# Patient Record
Sex: Male | Born: 1970
Health system: Southern US, Community
[De-identification: ages and names within clinical notes are randomized; demographics above are authoritative.]

## PROBLEM LIST (undated history)

## (undated) DIAGNOSIS — N200 Calculus of kidney: Secondary | ICD-10-CM

## (undated) DIAGNOSIS — F329 Major depressive disorder, single episode, unspecified: Secondary | ICD-10-CM

## (undated) DIAGNOSIS — F32A Depression, unspecified: Secondary | ICD-10-CM

---

## 2007-04-23 ENCOUNTER — Ambulatory Visit: Payer: Self-pay | Admitting: Family Medicine

## 2012-10-21 ENCOUNTER — Emergency Department: Payer: Self-pay | Admitting: Emergency Medicine

## 2012-10-21 ENCOUNTER — Encounter (HOSPITAL_COMMUNITY): Payer: Self-pay | Admitting: *Deleted

## 2012-10-21 ENCOUNTER — Inpatient Hospital Stay (HOSPITAL_COMMUNITY)
Admission: AD | Admit: 2012-10-21 | Discharge: 2012-10-25 | DRG: 430 | Disposition: A | Discharge status: Home / Self Care | Payer: BC Managed Care – PPO | Source: Intra-hospital | Attending: Psychiatry | Admitting: Psychiatry

## 2012-10-21 DIAGNOSIS — Z79899 Other long term (current) drug therapy: Secondary | ICD-10-CM

## 2012-10-21 DIAGNOSIS — R45851 Suicidal ideations: Secondary | ICD-10-CM

## 2012-10-21 DIAGNOSIS — F332 Major depressive disorder, recurrent severe without psychotic features: Principal | ICD-10-CM | POA: Diagnosis present

## 2012-10-21 HISTORY — DX: Depression, unspecified: F32.A

## 2012-10-21 HISTORY — DX: Major depressive disorder, single episode, unspecified: F32.9

## 2012-10-21 LAB — COMPREHENSIVE METABOLIC PANEL
BUN: 11 mg/dL (ref 7–18)
Bilirubin,Total: 0.8 mg/dL (ref 0.2–1.0)
Chloride: 107 mmol/L (ref 98–107)
EGFR (Non-African Amer.): >60
Potassium: 3.8 mmol/L (ref 3.5–5.1)
SGOT(AST): 20 U/L (ref 15–37)

## 2012-10-21 LAB — URINALYSIS, COMPLETE
Bacteria: NONE SEEN
Blood: NEGATIVE
Ph: 7 (ref 4.5–8.0)
RBC,UR: NONE SEEN /HPF (ref 0–5)
Specific Gravity: 1.024 (ref 1.003–1.030)
Squamous Epithelial: NONE SEEN

## 2012-10-21 LAB — CBC
MCV: 87 fL (ref 80–100)
Platelet: 246 10*3/uL (ref 150–440)
RDW: 12.5 % (ref 11.5–14.5)
WBC: 10 10*3/uL (ref 3.8–10.6)

## 2012-10-21 LAB — DRUG SCREEN, URINE
Cannabinoid 50 Ng, Ur ~~LOC~~: NEGATIVE (ref ?–50)
Cocaine Metabolite,Ur ~~LOC~~: NEGATIVE (ref ?–300)
Methadone, Ur Screen: NEGATIVE (ref ?–300)
Opiate, Ur Screen: NEGATIVE (ref ?–300)
Phencyclidine (PCP) Ur S: NEGATIVE (ref ?–25)
Tricyclic, Ur Screen: NEGATIVE (ref ?–1000)

## 2012-10-21 LAB — TSH: Thyroid Stimulating Horm: 1.93 u[IU]/mL

## 2012-10-21 LAB — ETHANOL: Ethanol %: <0.003 % (ref 0.000–0.080)

## 2012-10-21 MED ORDER — MAGNESIUM HYDROXIDE 400 MG/5ML PO SUSP: 30.0000 mL | Freq: Every day | ORAL | Status: DC | PRN

## 2012-10-21 MED ORDER — NICOTINE 14 MG/24HR TD PT24
14.0000 mg | MEDICATED_PATCH | Freq: Every day | TRANSDERMAL | Status: DC
  Filled 2012-10-21 (×4): qty 1

## 2012-10-21 MED ORDER — TRAZODONE HCL 50 MG PO TABS
50.0000 mg | ORAL_TABLET | Freq: Every evening | ORAL | Status: DC | PRN
  Filled 2012-10-21: qty 1
  Filled 2012-10-21: qty 6
  Filled 2012-10-21 (×5): qty 1
  Filled 2012-10-21: qty 6
  Filled 2012-10-21 (×4): qty 1

## 2012-10-21 MED ORDER — ACETAMINOPHEN 325 MG PO TABS: 650.0000 mg | ORAL_TABLET | Freq: Four times a day (QID) | ORAL | Status: DC | PRN

## 2012-10-21 MED ORDER — ALUM & MAG HYDROXIDE-SIMETH 200-200-20 MG/5ML PO SUSP: 30.0000 mL | ORAL | Status: DC | PRN
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
## 2012-10-21 NOTE — Tx Team (Signed)
Initial Interdisciplinary Treatment Plan  PATIENT STRENGTHS: (choose at least two) Ability for insight Active sense of humor Average or above average intelligence Capable of independent living Communication skills Financial means General fund of knowledge Motivation for treatment/growth Physical Health Supportive family/friends Work skills  PATIENT STRESSORS: Marital or family conflict   PROBLEM LIST: Problem List/Patient Goals Date to be addressed Date deferred Reason deferred Estimated date of resolution  Suicide Risk 10/21/12                                                      DISCHARGE CRITERIA:  Improved stabilization in mood, thinking, and/or behavior Need for constant or close observation no longer present  PRELIMINARY DISCHARGE PLAN: Outpatient therapy Return to previous living arrangement Return to previous work or school arrangements  PATIENT/FAMIILY INVOLVEMENT: This treatment plan has been presented to and reviewed with the patient, Richard Welch, and/or family member, .  The patient and family have been given the opportunity to ask questions and make suggestions.  Richard Welch 10/21/2012, 7:34 PM
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
## 2012-10-22 DIAGNOSIS — F332 Major depressive disorder, recurrent severe without psychotic features: Principal | ICD-10-CM

## 2012-10-22 MED ORDER — DULOXETINE HCL 20 MG PO CPEP
20.0000 mg | ORAL_CAPSULE | Freq: Every day | ORAL | Status: DC
  Administered 2012-10-22 – 2012-10-25 (×4): 20 mg via ORAL
  Filled 2012-10-22 (×5): qty 1
  Filled 2012-10-22: qty 3
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
## 2012-10-22 NOTE — Progress Notes (Signed)
Adult Psychoeducational Group Note  Date:  10/22/2012 Time:  12:50 PM  Group Topic/Focus:  Goals Group:   The focus of this group is to help patients establish daily goals to achieve during treatment and discuss how the patient can incorporate goal setting into their daily lives to aide in recovery.  Participation Level:  Active  Participation Quality:  Attentive, Sharing and Supportive  Affect:  Appropriate  Cognitive:  Alert and Appropriate  Insight: Good  Engagement in Group:  Engaged  Modes of Intervention:  Discussion, Socialization and Support  Additional Comments:  Montell shared his story when asked in group today. He was attentive and appropriate with everyone. He was given resources on support and wellness education. He expressed that he wanted to get marital counseling with his spouse.   Cathlean Cower 10/22/2012, 12:50 PM

## 2012-10-22 NOTE — [facility] Group Notes (Signed)
[facility] LCSW Group Therapy  Feelings Around Relapse 1:15 -2:30        10/22/2012  2:50 PM   Type of Therapy:  Group Therapy  Participation Level:  Appropriate  Participation Quality:  Appropriate  Affect:  Appropriate  Cognitive:  Attentive Appropriate  Insight:  Engaged  Engagement in Therapy:  Engaged  Modes of Intervention:  Discussion Exploration Problem-Solving Supportive  Summary of Progress/Problems:  The topic for today was feelings around relapse.  Patient discussed what relapse prevention is to them and identified triggers that are on the patient to relapse.  Patient processed his feelings toward relapse and was able to related to peers. Patient identified coping skills that can be used to prevent a relapse.Patient shared he cannot go back to holding things in but must talk with someone about his problems.   Wynn Banker 10/22/2012 2:50 PM

## 2012-10-22 NOTE — Progress Notes (Signed)
Adult Psychoeducational Group Note  Date:  10/22/2012 Time:  1:31 PM  Group Topic/Focus:  Relapse Prevention Planning:   The focus of this group is to define relapse and discuss the need for planning to combat relapse.  Participation Level:  Active  Participation Quality:  Appropriate and Attentive  Affect:  Appropriate  Cognitive:  Alert and Appropriate  Insight: Good  Engagement in Group:  Engaged  Modes of Intervention:  Discussion, Socialization and Support  Additional Comments:  Dayshaun participated well in the therapeutic games played today. The values and goals discussed were related to relapse prevention.  Cathlean Cower 10/22/2012, 1:31 PM

## 2012-10-22 NOTE — [facility] Suicide Risk Assessment (Signed)
[facility] INPATIENT:  Family/Significant Other Suicide Prevention Education  Suicide Prevention Education:  Education Completed; Kahne Helfand, Brother, [phone];    has been identified by the patient as the family member/significant other with whom the patient will be residing, and identified as the person(s) who will aid the patient in the event of a mental health crisis (suicidal ideations/suicide attempt).  With written consent from the patient, the family member/significant other has been provided the following suicide prevention education, prior to the and/or following the discharge of the patient.  The suicide prevention education provided includes the following:  Suicide risk factors  Suicide prevention and interventions  National Suicide Hotline telephone number  [facility] assessment telephone number  [facility] Emergency Assistance 911  [facility] and/or Residential Mobile Crisis Unit telephone number  Request made of family/significant other to:  Remove weapons (e.g., guns, rifles, knives), all items previously/currently identified as safety concern.  Patient does not have access to a gun.  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  Remove drugs/medications (over-the-counter, prescriptions, illicit drugs), all items previously/currently identified as a safety concern.  The family member/significant other verbalizes understanding of the suicide prevention education information provided.  The family member/significant other agrees to remove the items of safety concern listed above.  Wynn Banker 10/22/2012, 12:29 PM
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
## 2012-10-22 NOTE — Progress Notes (Signed)
D:  Patient's self inventory sheet, patient sleeps well, has good appetite, normal energy level, good attention span.  Rated depression and hopelessness #1.  SI off/on, contracts for safety.  Denied physical problems.  Zero pain goal, worst pain #1.  After discharge, plans "To do stuff for me and my lil girl and my make everything around positive not negative.  How long will I be here."  No problems taking meds after discharge. A:  Medications administered per MD orders.  Emotional support and encouragement given patient.  R:  Denied SI while talking with nurse.  Contracts for safety.   Denied HI.  Denied A/V hallucinations.  Will continue to monitor patient for safety with 15 minute checks.  Safety maintained.

## 2012-10-22 NOTE — Progress Notes (Signed)
[facility] Group Notes:  (Nursing/MHT/Case Management/Adjunct)  Date:  10/22/2012  Time:  10:33 PM  Type of Therapy:  Group Therapy  Participation Level:  Active  Participation Quality:  Appropriate  Affect:  Appropriate  Cognitive:  Appropriate  Insight:  Appropriate  Engagement in Group:  Engaged  Modes of Intervention:  Activity, Socialization and Support  Summary of Progress/Problems: Pt. Was engaged in activity. Pt. Stated his warning signs for early relapse prevention was focusing on the care of other instead of his own needs.  Sondra Come 10/22/2012, 10:33 PM

## 2012-10-22 NOTE — Progress Notes (Signed)
Patient ID: Richard Welch, male   DOB: 08/06/1970, 42 y.o.   MRN: [id number] D: Patient in room on approach. Pt presented with blunted mood and sad affect. Pt has been isolative in his room all evening with little interaction with peers. Pt denies SI/HI/AVH and pain. Pt did not attend evening karaoke. Pt denies any needs or concerns.  Cooperative with assessment. No acute distressed noted at this time.   A: Met with pt 1:1.  Writer encouraged pt to discuss feelings. Pt encouraged to come to staff with any question or concerns. 15 minutes checks for safety.  R: Patient remains safe.  Continue current POC.

## 2012-10-23 NOTE — Progress Notes (Signed)
The focus of this group is to help patients review their daily goal of treatment and discuss progress on daily workbooks. Pt attended the evening group session and responded to all discussion prompts from the Writer. Pt reported having a good day, the highlights of which were visits from his family, whom he says is very supportive of him and his treatment. Pt's affect was bright and he offered supportive comments to his peers.

## 2012-10-23 NOTE — Progress Notes (Signed)
D) Pt has attended the program and interacts with select peers. Rates his depression and hopelessness both at a 1.  Denies SI and HI. Became a little tearful today when talking about his wife and her taking out a 50 B on him.  A) Given support and reassurance along with praise. Encouraged to go to all the groups. R) Denies SI and HI.

## 2012-10-23 NOTE — [facility] Group Notes (Signed)
[facility] LCSW Group Therapy  Stages of Change 10/23/2012  1:41 PM    Type of Therapy:  Group Therapy  Participation Level:  Active  Participation Quality:  Appropriate  Affect:  Appropriate  Cognitive:  Appropriate  Insight:  Developing/Improving and Engaged  Engagement in Therapy:  Developing/Improving and Engaged  Modes of Intervention:  Discussion, Education, Exploration, Problem-Solving, Rapport Building, Support  Summary of Progress/Problems: The topic for today was the stages of changes.  Patient were asked to identify changes they need to make and their motivation and commitment to making changes in their lives.  Patient shared he has to change his attitude about himself and accept mistakes he has made.      Wynn Banker 10/23/2012  1:41 PM

## 2012-10-23 NOTE — Progress Notes (Signed)
.  Psychoeducational Group Note    Date: 10/23/2012 Time:  0915    Goal Setting Purpose of Group: To be able to set a goal that is measurable and that can be accomplished in one day Participation Level:  Active  Participation Quality:  Appropriate  Affect:  Appropriate  Cognitive:  Oriented  Insight:  Improving  Engagement in Group:  Engaged  Additional Comments:    Augustin Bun A 

## 2012-10-23 NOTE — Progress Notes (Signed)
[facility] MD Progress Note  10/23/2012 6:07 PM Sparsh Callens  MRN:  [id number] Subjective: [name] is a 42 y/o male with no previous psychiatric problems. 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  He reports that he has never hit his wife but will leave the house when he starts to get upset.  He then went to live with his mother and became severely depressed and began to have suicidal thoughts of overdosing on pills. The patient reports that he brought himself to the hospital to seek help as he did not want to leave his daughter without a father.  The patient reports that he has been participating in groups and looking forward to learning coping skills to help him with anger issues.  Diagnosis:   Assessment:  AXIS I: Major Depression, Recurrent severe  AXIS II: Deferred  AXIS III:  Past Medical History   Diagnosis  Date   .  Depression    AXIS IV: economic problems, housing problems, occupational problems, other psychosocial or environmental problems, problems related to social environment and problems with primary support group  AXIS V: 41-50 serious symptoms   ADL's:  Intact  Sleep: Poor  Appetite:  Good  Suicidal Ideation:  Plan:  Patient denies Intent:  Patient denies Means:  Patient denies Homicidal Ideation:  Plan:  Patient denies Intent:  Patient denies Means:  Patient denies AEB (as evidenced by): Patient denies  Psychiatric Specialty Exam: ROS  Blood pressure 125/78, pulse 86, temperature 98.2 F (36.8 C), temperature source Oral, resp. rate 18, height 5\' 7"  (1.702 m), weight 68.04 kg (150 lb).Body mass index is 23.49 kg/(m^2).  General Appearance: Bizarre  Eye Contact::  Good  Speech:  Clear and Coherent and Normal Rate  Volume:  Normal  Mood:  Euphoric  Affect:  Appropriate, Congruent and Full Range  Thought  Process:  Circumstantial, Linear and Logical  Orientation:  Full (Time, Place, and Person)  Thought Content:  WDL  Suicidal Thoughts:  Patient contracts for safety on the unit.  Homicidal Thoughts:  No  Memory:  Immediate;   Good Recent;   Fair Remote;   Good  Judgement:  Good  Insight:  Good  Psychomotor Activity:  Normal  Concentration:  Good  Recall:  Good  Akathisia:  Negative  Handed:  Right  AIMS (if indicated):   None  Assets:  Communication Skills Desire for Improvement Financial Resources/Insurance Housing Physical Health Resilience Social Support Vocational/Educational  Sleep:  Number of Hours: 5.75   Current Medications: Current Facility-Administered Medications  Medication Dose Route Frequency Provider Last Rate Last Dose  . acetaminophen (TYLENOL) tablet 650 mg  650 mg Oral Q6H PRN Kerry Hough, PA-C      . alum & mag hydroxide-simeth (MAALOX/MYLANTA) 200-200-20 MG/5ML suspension 30 mL  30 mL Oral Q4H PRN Kerry Hough, PA-C      . DULoxetine (CYMBALTA) DR capsule 20 mg  20 mg Oral Daily Nehemiah Settle, MD   20 mg at 10/23/12 0802  . magnesium hydroxide (MILK OF MAGNESIA) suspension 30 mL  30 mL Oral Daily PRN Kerry Hough, PA-C      . nicotine (NICODERM CQ - dosed in mg/24 hours) patch 14 mg  14 mg Transdermal Q0600 Kerry Hough, PA-C      . traZODone (  DESYREL) tablet 50 mg  50 mg Oral QHS,MR X 1 Kerry Hough, PA-C        Lab Results: No results found for this or any previous visit (from the past 48 hour(s)).  Physical Findings: AIMS: Facial and Oral Movements Muscles of Facial Expression: None, normal Lips and Perioral Area: None, normal Jaw: None, normal Tongue: None, normal,Extremity Movements Upper (arms, wrists, hands, fingers): None, normal Lower (legs, knees, ankles, toes): None, normal, Trunk Movements Neck, shoulders, hips: None, normal, Overall Severity Severity of abnormal movements (highest score from questions above):  None, normal Incapacitation due to abnormal movements: None, normal Patient's awareness of abnormal movements (rate only patient's report): No Awareness, Dental Status Current problems with teeth and/or dentures?: No Does patient usually wear dentures?: No  CIWA:  CIWA-Ar Total: 2 COWS:  COWS Total Score: 1  Treatment Plan Summary: Daily contact with patient to assess and evaluate symptoms and progress in treatment Medication management  Plan: Observation Level/Precautions: 15 minute checks   Laboratory: Reviewed.  Psychotherapy: Milieu and group therapy   Medications: Cymbalta-20 mg-wil consider increasing dose  Consultations: none   Discharge concerns: safety   Estimated LOS: 4-5 days   Other: Recommended couples counseling after court case as patient will not be able to do this in the hospital until court case is completed.    Medical Decision Making Problem Points:  Established problem, stable/improving (1), Review of last therapy session (1) and Review of psycho-social stressors (1) Data Points:  Review and summation of old records (2) Review of medication regiment & side effects (2) Review of new medications or change in dosage (2)  I certify that inpatient services furnished can reasonably be expected to improve the patient's condition.   Romanita Fager 10/23/2012, 6:07 PM

## 2012-10-23 NOTE — Progress Notes (Signed)
Psychoeducational Group Note  Date: 10/23/2012 Time:  1015  Group Topic/Focus:  Identifying Needs:   The focus of this group is to help patients identify their personal needs that have been historically problematic and identify healthy behaviors to address their needs.  Participation Level:  Active  Participation Quality:  Appropriate  Affect:  Appropriate  Cognitive:  Appropriate  Insight:  Improving  Engagement in Group:  Improving  Additional Comments:  Pt participated in group.   Javarus Dorner A  

## 2012-10-23 NOTE — Progress Notes (Signed)
D: Patient resting in bed with eyes closed. Respirations even and unlabored. No apparent distress noted.  A: Monitoring pt Q15 min for safety  R: Patient remains safe on the unit. Will continue to monitor.  

## 2012-10-24 NOTE — Progress Notes (Signed)
Psychoeducational Group Note  Psychoeducational Group Note  Date: 10/24/2012 Time:  10/24/2012  Group Topic/Focus:  Gratefulness:  The focus of this group is to help patients identify what two things they are most grateful for in their lives. What helps ground them and to center them on their work to their recovery.  Participation Level:  Active  Participation Quality:  Appropriate  Affect:  Appropriate  Cognitive:  Appropriate  Insight:  Lacking  Engagement in Group:  Engaged  Additional Comments:   Nya Monds A  

## 2012-10-24 NOTE — [facility] Group Notes (Signed)
[facility] LCSW Group Therapy  10/24/2012  10:00  Type of Therapy:  Group Therapy  Participation Level:  Active  Participation Quality:  Attentive  Affect:  Appropriate  Cognitive:  Oriented  Insight:  Limited  Engagement in Therapy:  Engaged  Modes of Intervention:  Discussion, Exploration and Socialization  Summary of Progress/Problems:   The main focus of today's process group was to identify the patient's current support system and decide on other supports that can be put in place. Four definitions/levels of support were discussed and an exercise was utilized to show how much stronger we become with additional supports. An emphasis was placed on using counselor, doctor, therapy groups, 12-step groups, and problem-specific support groups to expand supports, as well as doing something different than has been done before. Richard Welch identified his wife as his main support even though she took out IVC papers on him.  Knows now that he needed help, and is hopeful that she will forgive him and continue to be his main support.  Limited interaction, limited insight.  Rod [place], LCSW 10/24/2012 3:20 PM

## 2012-10-24 NOTE — Progress Notes (Signed)
Pt reports having a good day. Is visible on the unit, interacts appropriately. Affect appropriate and mood neutral. Is hopeful for discharge tomorrow. Supported, encouraged. Denies SI/HI/AVH and remains safe. Lawrence Marseilles

## 2012-10-24 NOTE — Progress Notes (Addendum)
[facility] MD Progress Note  10/24/2012 3:05 PM Erol Flanagin  MRN:  [id number] Subjective: [name] is a 42 y/o male with no previous psychiatric problems. The patient reports that he has been doing well on his current medications. He has been participating in groups and has been finding it helpful.  He denies any agitation or outburst.  The patient reports he is taking his medications and denies any side effects.  Diagnosis:   Assessment:  AXIS I: Major Depression, Recurrent severe  AXIS II: Deferred  AXIS III:  Past Medical History   Diagnosis  Date   .  Depression    AXIS IV: economic problems, housing problems, occupational problems, other psychosocial or environmental problems, problems related to social environment and problems with primary support group  AXIS V: 41-50 serious symptoms   ADL's:  Intact  Sleep: Fair  Appetite:  Good  Suicidal Ideation:  Plan:  Patient denies Intent:  Patient denies Means:  Patient denies Homicidal Ideation:  Plan:  Patient denies Intent:  Patient denies Means:  Patient denies AEB (as evidenced by): Patient denies  Psychiatric Specialty Exam: Review of Systems  Constitutional: Negative for fever, chills and weight loss.  Cardiovascular: Negative for chest pain, palpitations and leg swelling.  Gastrointestinal: Negative for heartburn, nausea, vomiting, abdominal pain, diarrhea and constipation.  Neurological: Negative for dizziness, tingling, tremors, sensory change, speech change and headaches.    Blood pressure 125/78, pulse 86, temperature 98.2 F (36.8 C), temperature source Oral, resp. rate 18, height 5\' 7"  (1.702 m), weight 68.04 kg (150 lb).Body mass index is 23.49 kg/(m^2).  General Appearance: Bizarre  Eye Contact::  Good  Speech:  Clear and Coherent and Normal Rate  Volume:  Normal  Mood:  Euphoric  Affect:  Appropriate, Congruent and Full Range  Thought Process:  Circumstantial, Linear and Logical  Orientation:  Full (Time,  Place, and Person)  Thought Content:  WDL  Suicidal Thoughts:  Patient contracts for safety on the unit.  Homicidal Thoughts:  No  Memory:  Immediate;   Good Recent;   Fair Remote;   Good  Judgement:  Good  Insight:  Good  Psychomotor Activity:  Normal  Concentration:  Good  Recall:  Good  Akathisia:  Negative  Handed:  Right  AIMS (if indicated):   None  Assets:  Communication Skills Desire for Improvement Financial Resources/Insurance Housing Physical Health Resilience Social Support Vocational/Educational  Sleep:  Number of Hours: 6.75   Current Medications: Current Facility-Administered Medications  Medication Dose Route Frequency Provider Last Rate Last Dose  . acetaminophen (TYLENOL) tablet 650 mg  650 mg Oral Q6H PRN Kerry Hough, PA-C      . alum & mag hydroxide-simeth (MAALOX/MYLANTA) 200-200-20 MG/5ML suspension 30 mL  30 mL Oral Q4H PRN Kerry Hough, PA-C      . DULoxetine (CYMBALTA) DR capsule 20 mg  20 mg Oral Daily Nehemiah Settle, MD   20 mg at 10/24/12 0839  . magnesium hydroxide (MILK OF MAGNESIA) suspension 30 mL  30 mL Oral Daily PRN Kerry Hough, PA-C      . traZODone (DESYREL) tablet 50 mg  50 mg Oral QHS,MR X 1 Kerry Hough, PA-C        Lab Results: No results found for this or any previous visit (from the past 48 hour(s)).  Physical Findings: AIMS: Facial and Oral Movements Muscles of Facial Expression: None, normal Lips and Perioral Area: None, normal Jaw: None, normal Tongue: None, normal,Extremity Movements  Upper (arms, wrists, hands, fingers): None, normal Lower (legs, knees, ankles, toes): None, normal, Trunk Movements Neck, shoulders, hips: None, normal, Overall Severity Severity of abnormal movements (highest score from questions above): None, normal Incapacitation due to abnormal movements: None, normal Patient's awareness of abnormal movements (rate only patient's report): No Awareness, Dental Status Current  problems with teeth and/or dentures?: No Does patient usually wear dentures?: No  CIWA:  CIWA-Ar Total: 2 COWS:  COWS Total Score: 1  Treatment Plan Summary: Daily contact with patient to assess and evaluate symptoms and progress in treatment Medication management  Plan: Observation Level/Precautions: 15 minute checks   Laboratory: Reviewed.  Psychotherapy: Milieu and group therapy   Medications: Cymbalta-20 mg-wil consider increasing dose if needed  Consultations: none   Discharge concerns: safety   Estimated LOS: 4-5 days-patient should be evaluated for possible D/C by Tuesday  Other: Recommended couples counseling after court case as patient will not be able to do this in the hospital until court case is completed.    Medical Decision Making Problem Points:  Established problem, stable/improving (1), Review of last therapy session (1) and Review of psycho-social stressors (1) Data Points:  Review and summation of old records (2) Review of medication regiment & side effects (2) Review of new medications or change in dosage (2)  I certify that inpatient services furnished can reasonably be expected to improve the patient's condition.   Halina Asano 10/24/2012, 3:05 PM

## 2012-10-24 NOTE — Progress Notes (Signed)
Pt attending the groups and interacting with his peers. Denies SI and HI. Rates his depression and hopelessness both at a 0. States he is feeling much better and feels ready to be discharged. Given support and reassurance along with praise.

## 2012-10-24 NOTE — Progress Notes (Signed)
Spoke with pt 1:1 who reports he's feeling better. States he was told he would most likely be discharged on Monday and feels comfortable with this plan. States his SI has passed and he is feeling more hopeful. Pt supported, encouraged. Offered trazadone but pt states he is able to sleep without it. Denies HI/AVH and remains safe. Lawrence Marseilles

## 2012-10-24 NOTE — Progress Notes (Signed)
Psychoeducational Group Note  Date:  10/24/2012 Time:  1015  Group Topic/Focus:  Making Healthy Choices:   The focus of this group is to help patients identify negative/unhealthy choices they were using prior to admission and identify positive/healthier coping strategies to replace them upon discharge.  Participation Level:  Active  Participation Quality:  Appropriate  Affect:  Depressed  Cognitive:  Alert  Insight:  Improving  Engagement in Group:  Improving  Additional Comments:    Angi Goodell A 10/24/2012 

## 2012-10-25 MED ORDER — TRAZODONE HCL 50 MG PO TABS: 50.0000 mg | ORAL_TABLET | Freq: Every evening | ORAL | Status: DC | PRN

## 2012-10-25 MED ORDER — DULOXETINE HCL 20 MG PO CPEP: 20.0000 mg | ORAL_CAPSULE | Freq: Every day | ORAL | Status: DC

## 2012-10-25 NOTE — Progress Notes (Signed)
Discharge Note:  Patient discharged home with family member.  Denied SI and HI.  Denied A/V hallucinations.  Denied pain.  Suicide prevention information given and discussed with patient, who stated he understood and had no questions.  Patient received all his belongings. belt, shoe laces, clothing, miscellaneous items, toiletries, prescriptions, medications.  Patient stated he appreciated all assistance received from [facility] staff.

## 2012-10-25 NOTE — BHH Group Notes (Signed)
[facility] LCSW Aftercare Discharge Planning Group Note  10/25/2012  1:15 PM    Participation Quality:  Appropriate   Mood/Affect:  Appropriate  Depression Rating:  0  Anxiety Rating:  0  Thoughts of Suicide:  No  Will you contract for safety?   No  Current AVH:  NA  Plan for Discharge/Comments:  Patient reports being much better and ready to discharge home today.  He requested a note for work.  Transportation Means: Patient has transportation.  Supports:  Patient has a good support system.   Shamir Sedlar, [name] July

## 2012-10-25 NOTE — Progress Notes (Signed)
Adult Psychoeducational Group Note  Date:  10/25/2012 Time:  6:47 PM  Group Topic/Focus:  Self Care:   The focus of this group is to help patients understand the importance of self-care in order to improve or restore emotional, physical, spiritual, interpersonal, and financial health.  Participation Level:  Active  Participation Quality:  Appropriate and Attentive  Affect:  Appropriate  Cognitive:  Appropriate  Insight: Appropriate and Good  Engagement in Group:  Developing/Improving  Modes of Intervention:  Activity, Discussion, Education, Socialization and Support  Additional Comments:  Richard Welch attended group and shared during group. Patient defined self care in his own term. Patient was given a self care assessment and completed form and gave incite at times during group of what the weakness and strengths of physical, psychological, spiritual, emotional, relationship care, based on how the patient rated the areas of assessment. Patient concluded with making a goal on how to improve in the areas that need attention.   [name] 10/25/2012, 6:47 PM

## 2012-10-25 NOTE — Progress Notes (Signed)
Adult Psychoeducational Group Note  Date:  10/25/2012 Time:  4:53 AM  Group Topic/Focus:  Wrap-Up Group:   The focus of this group is to help patients review their daily goal of treatment and discuss progress on daily workbooks.  Participation Level:  Active  Participation Quality:  Appropriate  Affect:  Appropriate  Cognitive:  Appropriate  Insight: Limited  Engagement in Group:  Engaged  Modes of Intervention:  Support  Additional Comments:  Patient attended and participated in group tonight. He reports having a good day. He went outside, socialized, attended groups and went to the dining room for meals. His brother came to visit with him today.   Lita Mains [facility] 10/25/2012, 4:53 AM

## 2012-10-25 NOTE — Progress Notes (Deleted)
[facility] Adult Case Management Discharge Plan :  Will you be returning to the same living situation after discharge: Yes,  Patient is discharging home with family. At discharge, do you have transportation home?:Yes,  Patient has arranged transportation. Do you have the ability to pay for your medications:Yes,  Patient has insurance and able to make co-payment  Release of information consent forms completed and in the chart;  Patient's signature needed at discharge.  Patient to Follow up at: Follow-up Information   Follow up with [facility] On 10/26/2012. (Please go to [facility] walk in clinic on Tuesday, October 26, 2012 at [place])    Contact information:   [address] [place], [place]        Patient denies SI/HI:   Patient no longer endorsing SI/HI or other thoughts of self harm.     Safety Planning and Suicide Prevention discussed: .Reviewed with all patients during discharge planning group   Ahana Najera, [name] July 10/25/2012, 10:46 AM
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
## 2012-10-25 NOTE — Tx Team (Signed)
Interdisciplinary Treatment Plan Update   Date Reviewed:  10/25/2012  Time Reviewed:  10:00 AM  Progress in Treatment:   Attending groups: Yes Participating in groups: Yes Taking medication as prescribed: Yes  Tolerating medication: Yes Family/Significant other contact made: Yes, contact made with brother.  Patient understands diagnosis: Yes  Discussing patient identified problems/goals with staff: Yes Medical problems stabilized or resolved: Yes Denies suicidal/homicidal ideation: Yes Patient has not harmed self or others: Yes  For review of initial/current patient goals, please see plan of care.  Estimated Length of Stay:  Discharge home today.  Reasons for Continued Hospitalization:   New Problems/Goals identified:    Discharge Plan or Barriers:   Home with outpatient follow up at [facility].  Additional Comments:  Patient reports doing well and rates all symptoms at zerol  Attendees:  Patient:  10/25/2012 10:00 AM   Signature: Mervyn Gay, MD 10/25/2012 10:00 AM  Signature: 10/25/2012 10:00 AM  Signature:  10/25/2012 10:00 AM  Signature:Beverly Terrilee Croak, RN 10/25/2012 10:00 AM  Signature:  Neill Loft RN 10/25/2012 10:00 AM  Signature:  Juline Patch, LCSW 10/25/2012 10:00 AM  Signature:  10/25/2012 10:00 AM  Signature:  Maseta Dorley,Care Coordinator 10/25/2012 10:00 AM  Signature: Fransisca Kaufmann, [facility] 10/25/2012 10:00 AM  Signature:    Signature:    Signature:      Scribe for Treatment Team:   Juline Patch,  10/25/2012 10:00 AM

## 2012-10-25 NOTE — Progress Notes (Signed)
D:  Patient's self inventory sheet, patient sleeps well, has good appetite, normal energy level, good attention span.   Denied depression and hopelessness.  Denied withdrawals.  Denied SI.  Denied physical problems.  Denied pain.  "Do things for me first and continue to take my medicine and get support from others to help me." A:  Medications administered per MD orders.  Emotional support and encouragement given patient. R:  Denede SI and HI.  Denied A/V hallucinations.  Will continue to monitor every 15 minutes for safety.  Safety maintained.

## 2012-10-25 NOTE — Progress Notes (Signed)
[facility] Adult Case Management Discharge Plan :  Will you be returning to the same living situation after discharge: Yes,  Patient discharging home with family. At discharge, do you have transportation home?:Yes,  Patient has transportation home. Do you have the ability to pay for your medications:Yes,  patient has the ability to obtain medications.  Release of information consent forms completed and in the chart;  Patient's signature needed at discharge.  Patient to Follow up at: Follow-up Information   Follow up with [facility] On 10/26/2012. (Please go to [facility] walk in clinic on Tuesday, October 26, 2012 at [facility])    Contact information:   [address] [place], [place]    [phone]      Patient denies SI/HI:   Patient no longer endorsing SI/HI or other thoughts of self harm.     Safety Planning and Suicide Prevention discussed:  .Reviewed with all patients during discharge planning group     Richard Welch 10/25/2012, 11:00 AM

## 2012-10-27 NOTE — Progress Notes (Signed)
Patient Discharge Instructions:  After Visit Summary (AVS):   Faxed to:  10/27/12 Discharge Summary Note:   Faxed to:  10/27/12 Psychiatric Admission Assessment Note:   Faxed to:  10/27/12 Suicide Risk Assessment - Discharge Assessment:   Faxed to:  10/27/12 Faxed/Sent to the Next Level Care provider:  10/27/12 Faxed to [facility] @ [phone]  Jerelene Redden, 10/27/2012, 2:58 PM

## 2014-02-28 ENCOUNTER — Encounter (HOSPITAL_COMMUNITY): Payer: Self-pay | Admitting: Emergency Medicine

## 2014-02-28 ENCOUNTER — Emergency Department (HOSPITAL_COMMUNITY)
Admission: EM | Admit: 2014-02-28 | Discharge: 2014-03-01 | Disposition: A | Discharge status: Home / Self Care | Payer: BC Managed Care – PPO | Attending: Emergency Medicine | Admitting: Emergency Medicine

## 2014-02-28 DIAGNOSIS — R7989 Other specified abnormal findings of blood chemistry: Secondary | ICD-10-CM | POA: Diagnosis not present

## 2014-02-28 DIAGNOSIS — R1032 Left lower quadrant pain: Secondary | ICD-10-CM | POA: Diagnosis present

## 2014-02-28 DIAGNOSIS — F329 Major depressive disorder, single episode, unspecified: Secondary | ICD-10-CM | POA: Diagnosis not present

## 2014-02-28 DIAGNOSIS — Z880 Allergy status to penicillin: Secondary | ICD-10-CM | POA: Diagnosis not present

## 2014-02-28 DIAGNOSIS — Z79899 Other long term (current) drug therapy: Secondary | ICD-10-CM | POA: Insufficient documentation

## 2014-02-28 DIAGNOSIS — R740 Nonspecific elevation of levels of transaminase and lactic acid dehydrogenase [LDH]: Secondary | ICD-10-CM | POA: Diagnosis not present

## 2014-02-28 DIAGNOSIS — N23 Unspecified renal colic: Secondary | ICD-10-CM | POA: Insufficient documentation

## 2014-02-28 DIAGNOSIS — R7401 Elevation of levels of liver transaminase levels: Secondary | ICD-10-CM

## 2014-02-28 HISTORY — DX: Calculus of kidney: N20.0

## 2014-02-28 MED ORDER — SODIUM CHLORIDE 0.9 % IV SOLN
1000.0000 mL | Freq: Once | INTRAVENOUS | Status: AC
  Administered 2014-03-01: 1000 mL via INTRAVENOUS

## 2014-02-28 MED ORDER — HYDROMORPHONE HCL 1 MG/ML IJ SOLN
1.0000 mg | Freq: Once | INTRAMUSCULAR | Status: AC
  Administered 2014-03-01: 1 mg via INTRAVENOUS
  Filled 2014-02-28: qty 1

## 2014-02-28 MED ORDER — SODIUM CHLORIDE 0.9 % IV SOLN
1000.0000 mL | INTRAVENOUS | Status: DC
  Administered 2014-03-01: 1000 mL via INTRAVENOUS

## 2014-02-28 MED ORDER — ONDANSETRON 8 MG PO TBDP
8.0000 mg | ORAL_TABLET | Freq: Once | ORAL | Status: AC
  Administered 2014-03-01: 8 mg via ORAL
  Filled 2014-02-28: qty 1

## 2014-02-28 NOTE — ED Provider Notes (Signed)
CSN: [id number]     Arrival date & time 02/28/14  2254 History  This chart was scribed for Dione Boozeavid Phil Corti, MD by Murriel HopperAlec Bankhead, ED Scribe. This patient was seen in room APA07/APA07 and the patient's care was started at 11:49 PM.  Chief Complaint  Patient presents with  . Abdominal Pain     (Consider location/radiation/quality/duration/timing/severity/associated sxs/prior Treatment) The history is provided by the patient. No language interpreter was used.   HPI Comments: Richard HawthorneJoseph Welch is a 43 y.o. male who presents to the Emergency Department complaining of constant, sharp, left lower abdomen pain that radiates to the suprapubic area that has worsened since the onset of pain 2 hours PTA. Pt reports pain as a 10/10. Pt reports having Diaphoresis, nausea, and one episode of vomiting but denies chills or fever. Pt reports having urinary urgency, but notes that voiding alleviates pain. Pt also notes that he had a kidney stone in the past (10+ years ago).    Past Medical History  Diagnosis Date  . Depression   . Kidney stone    History reviewed. No pertinent past surgical history. History reviewed. No pertinent family history. History  Substance Use Topics  . Smoking status: Never Smoker   . Smokeless tobacco: Never Used  . Alcohol Use: No    Review of Systems  Constitutional: Positive for diaphoresis. Negative for fever and chills.  Gastrointestinal: Positive for nausea and vomiting.  Genitourinary: Positive for urgency and difficulty urinating.  All other systems reviewed and are negative.     Allergies  Penicillins and Sulfa antibiotics  Home Medications   Prior to Admission medications   Medication Sig Start Date End Date Taking? Authorizing Provider  DULoxetine (CYMBALTA) 20 MG capsule Take 1 capsule (20 mg total) by mouth daily. For depression 10/25/12   Fransisca KaufmannLaura Davis, NP  traZODone (DESYREL) 50 MG tablet Take 1 tablet (50 mg total) by mouth at bedtime and may repeat dose  one time if needed. For sleep. 10/25/12   Fransisca KaufmannLaura Davis, NP   BP 156/91  Pulse 62  Temp(Src) 98.7 F (37.1 C) (Oral)  Resp 20  Ht 5\' 7"  (1.702 m)  Wt 170 lb (77.111 kg)  BMI 26.62 kg/m2  SpO2 99% Physical Exam  Nursing note and vitals reviewed. Constitutional: He is oriented to person, place, and time. He appears well-developed and well-nourished. No distress.  uncomfortable  HENT:  Head: Normocephalic and atraumatic.  Eyes: Conjunctivae are normal. Pupils are equal, round, and reactive to light. No scleral icterus.  Neck: Normal range of motion. Neck supple. No JVD present. No thyromegaly present.  Cardiovascular: Normal rate, regular rhythm and normal heart sounds.  Exam reveals no gallop and no friction rub.   No murmur heard. Pulmonary/Chest: Effort normal and breath sounds normal. No respiratory distress. He has no wheezes. He has no rales.  Abdominal: Soft. Bowel sounds are normal. He exhibits no distension and no mass. There is tenderness. There is no rebound and no guarding.  Left mid-abdomen tenderness No rebound or guarding Bowel sounds decreased No CVA tenderness  Musculoskeletal: Normal range of motion. He exhibits no edema.  Lymphadenopathy:    He has no cervical adenopathy.  Neurological: He is alert and oriented to person, place, and time. He has normal reflexes. No cranial nerve deficit. Coordination normal.  Skin: Skin is warm and dry. No rash noted.  Psychiatric: He has a normal mood and affect. His behavior is normal. Thought content normal.    ED Course  Procedures  DIAGNOSTIC STUDIES: Oxygen Saturation is 99% on RA, normal by my interpretation.    COORDINATION OF CARE: 11:54 PM Discussed treatment plan with pt at bedside and pt agreed to plan.   Labs Review Results for orders placed during the hospital encounter of 02/28/14  URINALYSIS, ROUTINE W REFLEX MICROSCOPIC      Result Value Ref Range   Color, Urine YELLOW  YELLOW   APPearance CLEAR  CLEAR    Specific Gravity, Urine >1.030 (*) 1.005 - 1.030   pH 5.5  5.0 - 8.0   Glucose, UA NEGATIVE  NEGATIVE mg/dL   Hgb urine dipstick LARGE (*) NEGATIVE   Bilirubin Urine NEGATIVE  NEGATIVE   Ketones, ur NEGATIVE  NEGATIVE mg/dL   Protein, ur NEGATIVE  NEGATIVE mg/dL   Urobilinogen, UA 0.2  0.0 - 1.0 mg/dL   Nitrite NEGATIVE  NEGATIVE   Leukocytes, UA NEGATIVE  NEGATIVE  CBC WITH DIFFERENTIAL      Result Value Ref Range   WBC 10.5  4.0 - 10.5 K/uL   RBC 4.66  4.22 - 5.81 MIL/uL   Hemoglobin 14.0  13.0 - 17.0 g/dL   HCT 62.940.5  52.839.0 - 41.352.0 %   MCV 86.9  78.0 - 100.0 fL   MCH 30.0  26.0 - 34.0 pg   MCHC 34.6  30.0 - 36.0 g/dL   RDW 24.412.5  01.011.5 - 27.215.5 %   Platelets 231  150 - 400 K/uL   Neutrophils Relative % 72  43 - 77 %   Neutro Abs 7.5  1.7 - 7.7 K/uL   Lymphocytes Relative 22  12 - 46 %   Lymphs Abs 2.3  0.7 - 4.0 K/uL   Monocytes Relative 5  3 - 12 %   Monocytes Absolute 0.5  0.1 - 1.0 K/uL   Eosinophils Relative 1  0 - 5 %   Eosinophils Absolute 0.2  0.0 - 0.7 K/uL   Basophils Relative 0  0 - 1 %   Basophils Absolute 0.0  0.0 - 0.1 K/uL  COMPREHENSIVE METABOLIC PANEL      Result Value Ref Range   Sodium 142  137 - 147 mEq/L   Potassium 3.8  3.7 - 5.3 mEq/L   Chloride 99  96 - 112 mEq/L   CO2 28  19 - 32 mEq/L   Glucose, Bld 148 (*) 70 - 99 mg/dL   BUN 14  6 - 23 mg/dL   Creatinine, Ser 5.361.43 (*) 0.50 - 1.35 mg/dL   Calcium 64.410.0  8.4 - 03.410.5 mg/dL   Total Protein 7.8  6.0 - 8.3 g/dL   Albumin 4.5  3.5 - 5.2 g/dL   AST 39 (*) 0 - 37 U/L   ALT 59 (*) 0 - 53 U/L   Alkaline Phosphatase 81  39 - 117 U/L   Total Bilirubin 0.5  0.3 - 1.2 mg/dL   GFR calc non Af Amer 60 (*) >90 mL/min   GFR calc Af Amer 69 (*) >90 mL/min   Anion gap 15  5 - 15  LIPASE, BLOOD      Result Value Ref Range   Lipase 29  11 - 59 U/L  URINE MICROSCOPIC-ADD ON      Result Value Ref Range   Squamous Epithelial / LPF RARE  RARE   RBC / HPF 3-6  <3 RBC/hpf   Bacteria, UA RARE  RARE   Urine-Other  MICROSCOPIC EXAM PERFORMED ON UNCONCENTRATED URINE     Imaging Review Ct  Renal Stone Study  03/01/2014   CLINICAL DATA:  Left lower quadrant pain radiating to the suprapubic area for 2 hr. Nausea and vomiting. Previous history of kidney stones 10 years ago.  EXAM: CT ABDOMEN AND PELVIS WITHOUT CONTRAST  TECHNIQUE: Multidetector CT imaging of the abdomen and pelvis was performed following the standard protocol without IV contrast.  COMPARISON:  None.  FINDINGS: Lung bases are clear.  Tiny calcification in the distal left ureter just above the ureterovesical junction, measuring less than 2 mm diameter. There is mild to moderate proximal ureterectasis and pyelocaliectasis on the left with moderate periureteral stranding. Changes are consistent with moderate obstruction. Right kidney and ureter are normal. The no bladder stone or bladder wall thickening identified.  Prominent diffuse fatty infiltration throughout the liver. The unenhanced appearance of the gallbladder, spleen, pancreas, adrenal glands, abdominal aorta, inferior vena cava, and retroperitoneal lymph nodes is unremarkable. Stomach, small bowel, and colon are not abnormally distended. No free air or free fluid in the abdomen. Minimal umbilical hernia containing fat.  Pelvis: Appendix is normal. Prostate gland is not enlarged. No free or loculated pelvic fluid collections. No pelvic mass or lymphadenopathy is appreciated. No destructive bone lesions.  IMPRESSION: Punctate size stone in the distal left ureter with moderate proximal obstruction. Diffuse fatty infiltration of the liver.   Electronically Signed   By: Burman Nieves M.D.   On: 03/01/2014 01:00   Images viewed by me.  MDM   Final diagnoses:  Abdominal pain, LLQ (left lower quadrant)  Ureteral colic  Elevated serum creatinine  Elevated transaminase level    Left-sided pain worrisome for possible kidney stone, consider possibility of diverticulitis. He'll be given IV fluids, IV  hydromorphone for pain and ondansetron for nausea. He will be sent for a CT scan. Old records are reviewed and he has no relevant past visits.  He had good relief of pain with the above noted treatment. CT shows a distal left ureteral calculus. Incidental finding of fatty infiltration of liver. Creatinine has come back mildly elevated as have AST and ALT. Patient states that he does not consume alcohol at all. These will need to be followed up as an outpatient. Mild hyperglycemia is also present and will need to be rechecked. He is discharged with prescriptions for ondansetron and oxycodone-acetaminophen. He is advised to avoid NSAIDs.  I personally performed the services described in this documentation, which was scribed in my presence. The recorded information has been reviewed and is accurate.     Dione Booze, MD 03/01/14 [phone]

## 2014-02-28 NOTE — ED Notes (Signed)
LLq pain , no n/v,   Pt says feel like kidney stone.

## 2014-03-01 ENCOUNTER — Emergency Department (HOSPITAL_COMMUNITY): Payer: BC Managed Care – PPO

## 2014-03-01 LAB — URINALYSIS, ROUTINE W REFLEX MICROSCOPIC
BILIRUBIN URINE: NEGATIVE
GLUCOSE, UA: NEGATIVE mg/dL
Ketones, ur: NEGATIVE mg/dL
Leukocytes, UA: NEGATIVE
Nitrite: NEGATIVE
PH: 5.5 (ref 5.0–8.0)
Protein, ur: NEGATIVE mg/dL
Urobilinogen, UA: 0.2 mg/dL (ref 0.0–1.0)

## 2014-03-01 LAB — COMPREHENSIVE METABOLIC PANEL
ALK PHOS: 81 U/L (ref 39–117)
ALT: 59 U/L — AB (ref 0–53)
ANION GAP: 15 (ref 5–15)
AST: 39 U/L — ABNORMAL HIGH (ref 0–37)
Albumin: 4.5 g/dL (ref 3.5–5.2)
BUN: 14 mg/dL (ref 6–23)
CALCIUM: 10 mg/dL (ref 8.4–10.5)
CO2: 28 meq/L (ref 19–32)
Chloride: 99 mEq/L (ref 96–112)
Creatinine, Ser: 1.43 mg/dL — ABNORMAL HIGH (ref 0.50–1.35)
GFR, EST AFRICAN AMERICAN: 69 mL/min — AB (ref >90)
GFR, EST NON AFRICAN AMERICAN: 60 mL/min — AB (ref >90)
GLUCOSE: 148 mg/dL — AB (ref 70–99)
Potassium: 3.8 mEq/L (ref 3.7–5.3)
SODIUM: 142 meq/L (ref 137–147)
Total Bilirubin: 0.5 mg/dL (ref 0.3–1.2)
Total Protein: 7.8 g/dL (ref 6.0–8.3)

## 2014-03-01 LAB — CBC WITH DIFFERENTIAL/PLATELET
Basophils Absolute: 0 10*3/uL (ref 0.0–0.1)
Basophils Relative: 0 % (ref 0–1)
EOS PCT: 1 % (ref 0–5)
Eosinophils Absolute: 0.2 10*3/uL (ref 0.0–0.7)
HEMATOCRIT: 40.5 % (ref 39.0–52.0)
HEMOGLOBIN: 14 g/dL (ref 13.0–17.0)
LYMPHS ABS: 2.3 10*3/uL (ref 0.7–4.0)
LYMPHS PCT: 22 % (ref 12–46)
MCH: 30 pg (ref 26.0–34.0)
MCHC: 34.6 g/dL (ref 30.0–36.0)
MCV: 86.9 fL (ref 78.0–100.0)
MONOS PCT: 5 % (ref 3–12)
Monocytes Absolute: 0.5 10*3/uL (ref 0.1–1.0)
Neutro Abs: 7.5 10*3/uL (ref 1.7–7.7)
Neutrophils Relative %: 72 % (ref 43–77)
PLATELETS: 231 10*3/uL (ref 150–400)
RBC: 4.66 MIL/uL (ref 4.22–5.81)
RDW: 12.5 % (ref 11.5–15.5)
WBC: 10.5 10*3/uL (ref 4.0–10.5)

## 2014-03-01 LAB — URINE MICROSCOPIC-ADD ON

## 2014-03-01 LAB — LIPASE, BLOOD: Lipase: 29 U/L (ref 11–59)

## 2014-03-01 MED ORDER — ONDANSETRON HCL 4 MG PO TABS: 4.0000 mg | ORAL_TABLET | Freq: Four times a day (QID) | ORAL | Status: DC | PRN

## 2014-03-01 MED ORDER — OXYCODONE-ACETAMINOPHEN 5-325 MG PO TABS: 1.0000 | ORAL_TABLET | ORAL | Status: DC | PRN

## 2014-03-01 NOTE — Discharge Instructions (Signed)
Blood tests of your kidney (creatinine) and liver (AST and ALT) were slightly elevated today. These should be rechecked in 2-4 weeks by your doctor.  Kidney Stones Kidney stones (urolithiasis) are deposits that form inside your kidneys. The intense pain is caused by the stone moving through the urinary tract. When the stone moves, the ureter goes into spasm around the stone. The stone is usually passed in the urine.  CAUSES   A disorder that makes certain neck glands produce too much parathyroid hormone (primary hyperparathyroidism).  A buildup of uric acid crystals, similar to gout in your joints.  Narrowing (stricture) of the ureter.  A kidney obstruction present at birth (congenital obstruction).  Previous surgery on the kidney or ureters.  Numerous kidney infections. SYMPTOMS   Feeling sick to your stomach (nauseous).  Throwing up (vomiting).  Blood in the urine (hematuria).  Pain that usually spreads (radiates) to the groin.  Frequency or urgency of urination. DIAGNOSIS   Taking a history and physical exam.  Blood or urine tests.  CT scan.  Occasionally, an examination of the inside of the urinary bladder (cystoscopy) is performed. TREATMENT   Observation.  Increasing your fluid intake.  Extracorporeal shock wave lithotripsy--This is a noninvasive procedure that uses shock waves to break up kidney stones.  Surgery may be needed if you have severe pain or persistent obstruction. There are various surgical procedures. Most of the procedures are performed with the use of small instruments. Only small incisions are needed to accommodate these instruments, so recovery time is minimized. The size, location, and chemical composition are all important variables that will determine the proper choice of action for you. Talk to your health care provider to better understand your situation so that you will minimize the risk of injury to yourself and your kidney.  HOME CARE  INSTRUCTIONS   Drink enough water and fluids to keep your urine clear or pale yellow. This will help you to pass the stone or stone fragments.  Strain all urine through the provided strainer. Keep all particulate matter and stones for your health care provider to see. The stone causing the pain may be as small as a grain of salt. It is very important to use the strainer each and every time you pass your urine. The collection of your stone will allow your health care provider to analyze it and verify that a stone has actually passed. The stone analysis will often identify what you can do to reduce the incidence of recurrences.  Only take over-the-counter or prescription medicines for pain, discomfort, or fever as directed by your health care provider.  Make a follow-up appointment with your health care provider as directed.  Get follow-up X-rays if required. The absence of pain does not always mean that the stone has passed. It may have only stopped moving. If the urine remains completely obstructed, it can cause loss of kidney function or even complete destruction of the kidney. It is your responsibility to make sure X-rays and follow-ups are completed. Ultrasounds of the kidney can show blockages and the status of the kidney. Ultrasounds are not associated with any radiation and can be performed easily in a matter of minutes. SEEK MEDICAL CARE IF:  You experience pain that is progressive and unresponsive to any pain medicine you have been prescribed. SEEK IMMEDIATE MEDICAL CARE IF:   Pain cannot be controlled with the prescribed medicine.  You have a fever or shaking chills.  The severity or intensity of pain increases  over 18 hours and is not relieved by pain medicine.  You develop a new onset of abdominal pain.  You feel faint or pass out.  You are unable to urinate. MAKE SURE YOU:   Understand these instructions.  Will watch your condition.  Will get help right away if you are  not doing well or get worse. Document Released: 04/28/2005 Document Revised: 12/29/2012 Document Reviewed: 09/29/2012 [facility] Patient Information 2015 [place], [place]. This information is not intended to replace advice given to you by your health care provider. Make sure you discuss any questions you have with your health care provider.  Acetaminophen; Oxycodone tablets What is this medicine? ACETAMINOPHEN; OXYCODONE (a set a MEE noe fen; ox i KOE done) is a pain reliever. It is used to treat mild to moderate pain. This medicine may be used for other purposes; ask your health care provider or pharmacist if you have questions. COMMON BRAND NAME(S): Endocet, Magnacet, Narvox, Percocet, Perloxx, Primalev, Primlev, Roxicet, Xolox What should I tell my health care provider before I take this medicine? They need to know if you have any of these conditions: -brain tumor -Crohn's disease, inflammatory bowel disease, or ulcerative colitis -drug abuse or addiction -head injury -heart or circulation problems -if you often drink alcohol -kidney disease or problems going to the bathroom -liver disease -lung disease, asthma, or breathing problems -an unusual or allergic reaction to acetaminophen, oxycodone, other opioid analgesics, other medicines, foods, dyes, or preservatives -pregnant or trying to get pregnant -breast-feeding How should I use this medicine? Take this medicine by mouth with a full glass of water. Follow the directions on the prescription label. Take your medicine at regular intervals. Do not take your medicine more often than directed. Talk to your pediatrician regarding the use of this medicine in children. Special care may be needed. Patients over 41 years old may have a stronger reaction and need a smaller dose. Overdosage: If you think you have taken too much of this medicine contact a poison control center or emergency room at once. NOTE: This medicine is only for you. Do not  share this medicine with others. What if I miss a dose? If you miss a dose, take it as soon as you can. If it is almost time for your next dose, take only that dose. Do not take double or extra doses. What may interact with this medicine? -alcohol -antihistamines -barbiturates like amobarbital, butalbital, butabarbital, methohexital, pentobarbital, phenobarbital, thiopental, and secobarbital -benztropine -drugs for bladder problems like solifenacin, trospium, oxybutynin, tolterodine, hyoscyamine, and methscopolamine -drugs for breathing problems like ipratropium and tiotropium -drugs for certain stomach or intestine problems like propantheline, homatropine methylbromide, glycopyrrolate, atropine, belladonna, and dicyclomine -general anesthetics like etomidate, ketamine, nitrous oxide, propofol, desflurane, enflurane, halothane, isoflurane, and sevoflurane -medicines for depression, anxiety, or psychotic disturbances -medicines for sleep -muscle relaxants -naltrexone -narcotic medicines (opiates) for pain -phenothiazines like perphenazine, thioridazine, chlorpromazine, mesoridazine, fluphenazine, prochlorperazine, promazine, and trifluoperazine -scopolamine -tramadol -trihexyphenidyl This list may not describe all possible interactions. Give your health care provider a list of all the medicines, herbs, non-prescription drugs, or dietary supplements you use. Also tell them if you smoke, drink alcohol, or use illegal drugs. Some items may interact with your medicine. What should I watch for while using this medicine? Tell your doctor or health care professional if your pain does not go away, if it gets worse, or if you have new or a different type of pain. You may develop tolerance to the medicine. Tolerance means that  you will need a higher dose of the medication for pain relief. Tolerance is normal and is expected if you take this medicine for a long time. Do not suddenly stop taking your  medicine because you may develop a severe reaction. Your body becomes used to the medicine. This does NOT mean you are addicted. Addiction is a behavior related to getting and using a drug for a non-medical reason. If you have pain, you have a medical reason to take pain medicine. Your doctor will tell you how much medicine to take. If your doctor wants you to stop the medicine, the dose will be slowly lowered over time to avoid any side effects. You may get drowsy or dizzy. Do not drive, use machinery, or do anything that needs mental alertness until you know how this medicine affects you. Do not stand or sit up quickly, especially if you are an older patient. This reduces the risk of dizzy or fainting spells. Alcohol may interfere with the effect of this medicine. Avoid alcoholic drinks. There are different types of narcotic medicines (opiates) for pain. If you take more than one type at the same time, you may have more side effects. Give your health care provider a list of all medicines you use. Your doctor will tell you how much medicine to take. Do not take more medicine than directed. Call emergency for help if you have problems breathing. The medicine will cause constipation. Try to have a bowel movement at least every 2 to 3 days. If you do not have a bowel movement for 3 days, call your doctor or health care professional. Do not take Tylenol (acetaminophen) or medicines that have acetaminophen with this medicine. Too much acetaminophen can be very dangerous. Many nonprescription medicines contain acetaminophen. Always read the labels carefully to avoid taking more acetaminophen. What side effects may I notice from receiving this medicine? Side effects that you should report to your doctor or health care professional as soon as possible: -allergic reactions like skin rash, itching or hives, swelling of the face, lips, or tongue -breathing difficulties, wheezing -confusion -light headedness or  fainting spells -severe stomach pain -unusually weak or tired -yellowing of the skin or the whites of the eyes Side effects that usually do not require medical attention (report to your doctor or health care professional if they continue or are bothersome): -dizziness -drowsiness -nausea -vomiting This list may not describe all possible side effects. Call your doctor for medical advice about side effects. You may report side effects to FDA at 1-800-FDA-1088. Where should I keep my medicine? Keep out of the reach of children. This medicine can be abused. Keep your medicine in a safe place to protect it from theft. Do not share this medicine with anyone. Selling or giving away this medicine is dangerous and against the law. Store at room temperature between 20 and 25 degrees C (68 and 77 degrees F). Keep container tightly closed. Protect from light. This medicine may cause accidental overdose and death if it is taken by other adults, children, or pets. Flush any unused medicine down the toilet to reduce the chance of harm. Do not use the medicine after the expiration date. NOTE: This sheet is a summary. It may not cover all possible information. If you have questions about this medicine, talk to your doctor, pharmacist, or health care provider.  2015, [employer] Standard. (2012-12-20 13:17:35)  Ondansetron tablets What is this medicine? ONDANSETRON (on DAN se tron) is used to treat nausea and  vomiting caused by chemotherapy. It is also used to prevent or treat nausea and vomiting after surgery. This medicine may be used for other purposes; ask your health care provider or pharmacist if you have questions. COMMON BRAND NAME(S): Zofran What should I tell my health care provider before I take this medicine? They need to know if you have any of these conditions: -heart disease -history of irregular heartbeat -liver disease -low levels of magnesium or potassium in the blood -an unusual or  allergic reaction to ondansetron, granisetron, other medicines, foods, dyes, or preservatives -pregnant or trying to get pregnant -breast-feeding How should I use this medicine? Take this medicine by mouth with a glass of water. Follow the directions on your prescription label. Take your doses at regular intervals. Do not take your medicine more often than directed. Talk to your pediatrician regarding the use of this medicine in children. Special care may be needed. Overdosage: If you think you have taken too much of this medicine contact a poison control center or emergency room at once. NOTE: This medicine is only for you. Do not share this medicine with others. What if I miss a dose? If you miss a dose, take it as soon as you can. If it is almost time for your next dose, take only that dose. Do not take double or extra doses. What may interact with this medicine? Do not take this medicine with any of the following medications: -apomorphine -certain medicines for fungal infections like fluconazole, itraconazole, ketoconazole, posaconazole, voriconazole -cisapride -dofetilide -dronedarone -pimozide -thioridazine -ziprasidone This medicine may also interact with the following medications: -carbamazepine -certain medicines for depression, anxiety, or psychotic disturbances -fentanyl -linezolid -MAOIs like Carbex, Eldepryl, Marplan, Nardil, and Parnate -methylene blue (injected into a vein) -other medicines that prolong the QT interval (cause an abnormal heart rhythm) -phenytoin -rifampicin -tramadol This list may not describe all possible interactions. Give your health care provider a list of all the medicines, herbs, non-prescription drugs, or dietary supplements you use. Also tell them if you smoke, drink alcohol, or use illegal drugs. Some items may interact with your medicine. What should I watch for while using this medicine? Check with your doctor or health care professional  right away if you have any sign of an allergic reaction. What side effects may I notice from receiving this medicine? Side effects that you should report to your doctor or health care professional as soon as possible: -allergic reactions like skin rash, itching or hives, swelling of the face, lips or tongue -breathing problems -confusion -dizziness -fast or irregular heartbeat -feeling faint or lightheaded, falls -fever and chills -loss of balance or coordination -seizures -sweating -swelling of the hands or feet -tightness in the chest -tremors -unusually weak or tired Side effects that usually do not require medical attention (report to your doctor or health care professional if they continue or are bothersome): -constipation or diarrhea -headache This list may not describe all possible side effects. Call your doctor for medical advice about side effects. You may report side effects to FDA at 1-800-FDA-1088. Where should I keep my medicine? Keep out of the reach of children. Store between 2 and 30 degrees C (36 and 86 degrees F). Throw away any unused medicine after the expiration date. NOTE: This sheet is a summary. It may not cover all possible information. If you have questions about this medicine, talk to your doctor, pharmacist, or health care provider.  2015, [employer] Standard. (2013-02-02 16:27:45)

## 2014-03-01 NOTE — ED Notes (Signed)
Pt alert & oriented x4, stable gait. Patient given discharge instructions, paperwork & prescription(s). Patient  instructed to stop at the registration desk to finish any additional paperwork. Patient verbalized understanding. Pt left department w/ no further questions. 

## 2014-03-06 MED FILL — Ondansetron HCl Tab 4 MG: ORAL | Qty: 4 | Status: AC

## 2014-03-06 MED FILL — Oxycodone w/ Acetaminophen Tab 5-325 MG: ORAL | Qty: 6 | Status: AC

## 2014-04-10 LAB — URINALYSIS, COMPLETE
BACTERIA: NONE SEEN
BLOOD: NEGATIVE
Bilirubin,UR: NEGATIVE
Glucose,UR: 150 mg/dL (ref 0–75)
Leukocyte Esterase: NEGATIVE
Nitrite: NEGATIVE
PROTEIN: NEGATIVE
Ph: 5 (ref 4.5–8.0)
RBC,UR: 1 /HPF (ref 0–5)
Specific Gravity: 1.021 (ref 1.003–1.030)
Squamous Epithelial: <1

## 2014-04-10 LAB — COMPREHENSIVE METABOLIC PANEL
ALBUMIN: 4.2 g/dL (ref 3.4–5.0)
Alkaline Phosphatase: 82 U/L
Anion Gap: 10 (ref 7–16)
BILIRUBIN TOTAL: 0.6 mg/dL (ref 0.2–1.0)
BUN: 18 mg/dL (ref 7–18)
CHLORIDE: 101 mmol/L (ref 98–107)
CREATININE: 1.24 mg/dL (ref 0.60–1.30)
Calcium, Total: 9.2 mg/dL (ref 8.5–10.1)
Co2: 28 mmol/L (ref 21–32)
EGFR (African American): >60
EGFR (Non-African Amer.): >60
Glucose: 202 mg/dL — ABNORMAL HIGH (ref 65–99)
OSMOLALITY: 285 (ref 275–301)
POTASSIUM: 3.8 mmol/L (ref 3.5–5.1)
SGOT(AST): 36 U/L (ref 15–37)
SGPT (ALT): 56 U/L
Sodium: 139 mmol/L (ref 136–145)
TOTAL PROTEIN: 7.9 g/dL (ref 6.4–8.2)

## 2014-04-10 LAB — CBC
HCT: 44.9 % (ref 40.0–52.0)
HGB: 15.1 g/dL (ref 13.0–18.0)
MCH: 29.9 pg (ref 26.0–34.0)
MCHC: 33.6 g/dL (ref 32.0–36.0)
MCV: 89 fL (ref 80–100)
PLATELETS: 234 10*3/uL (ref 150–440)
RBC: 5.05 10*6/uL (ref 4.40–5.90)
RDW: 12.7 % (ref 11.5–14.5)
WBC: 8.7 10*3/uL (ref 3.8–10.6)

## 2014-04-10 LAB — DRUG SCREEN, URINE

## 2014-04-10 LAB — ACETAMINOPHEN LEVEL: Acetaminophen: <2 ug/mL

## 2014-04-10 LAB — ETHANOL: Ethanol: <3 mg/dL

## 2014-04-10 LAB — SALICYLATE LEVEL: Salicylates, Serum: <1.7 mg/dL

## 2014-04-11 ENCOUNTER — Inpatient Hospital Stay: Payer: Self-pay | Admitting: Psychiatry

## 2014-04-11 LAB — HEMOGLOBIN A1C: HEMOGLOBIN A1C: 5.7 % (ref 4.2–6.3)

## 2014-09-02 NOTE — Consult Note (Signed)
Called for elevated sugar.hemoglobin a1c is 5.7- patient is not a diabetic.consult needed please call back.  Electronic Signatures: Alford HighlandWieting, Quantae Martel (MD)  (Signed on 02-Dec-15 13:22)  Authored  Last Updated: 02-Dec-15 13:22 by Alford HighlandWieting, Romen Yutzy (MD)
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
## 2018-02-02 ENCOUNTER — Encounter (HOSPITAL_COMMUNITY): Payer: Self-pay

## 2018-02-02 ENCOUNTER — Other Ambulatory Visit: Payer: Self-pay

## 2018-02-02 ENCOUNTER — Emergency Department (HOSPITAL_COMMUNITY)
Admission: EM | Admit: 2018-02-02 | Discharge: 2018-02-02 | Disposition: A | Discharge status: Home / Self Care | Payer: BC Managed Care – PPO | Attending: Emergency Medicine | Admitting: Emergency Medicine

## 2018-02-02 ENCOUNTER — Emergency Department (HOSPITAL_COMMUNITY): Payer: BC Managed Care – PPO

## 2018-02-02 DIAGNOSIS — Z79899 Other long term (current) drug therapy: Secondary | ICD-10-CM | POA: Diagnosis not present

## 2018-02-02 DIAGNOSIS — M79622 Pain in left upper arm: Secondary | ICD-10-CM | POA: Diagnosis not present

## 2018-02-02 DIAGNOSIS — S7002XA Contusion of left hip, initial encounter: Secondary | ICD-10-CM | POA: Insufficient documentation

## 2018-02-02 DIAGNOSIS — Z23 Encounter for immunization: Secondary | ICD-10-CM | POA: Insufficient documentation

## 2018-02-02 DIAGNOSIS — S40022A Contusion of left upper arm, initial encounter: Secondary | ICD-10-CM | POA: Diagnosis not present

## 2018-02-02 DIAGNOSIS — Y92414 Local residential or business street as the place of occurrence of the external cause: Secondary | ICD-10-CM | POA: Diagnosis not present

## 2018-02-02 DIAGNOSIS — S4992XA Unspecified injury of left shoulder and upper arm, initial encounter: Secondary | ICD-10-CM | POA: Diagnosis not present

## 2018-02-02 DIAGNOSIS — S40012A Contusion of left shoulder, initial encounter: Secondary | ICD-10-CM

## 2018-02-02 DIAGNOSIS — M25552 Pain in left hip: Secondary | ICD-10-CM | POA: Diagnosis not present

## 2018-02-02 DIAGNOSIS — S79912A Unspecified injury of left hip, initial encounter: Secondary | ICD-10-CM | POA: Diagnosis not present

## 2018-02-02 DIAGNOSIS — Y999 Unspecified external cause status: Secondary | ICD-10-CM | POA: Insufficient documentation

## 2018-02-02 DIAGNOSIS — Y9389 Activity, other specified: Secondary | ICD-10-CM | POA: Diagnosis not present

## 2018-02-02 DIAGNOSIS — T07XXXA Unspecified multiple injuries, initial encounter: Secondary | ICD-10-CM | POA: Insufficient documentation

## 2018-02-02 DIAGNOSIS — S301XXA Contusion of abdominal wall, initial encounter: Secondary | ICD-10-CM | POA: Diagnosis not present

## 2018-02-02 MED ORDER — NAPROXEN 500 MG PO TABS: 500.0000 mg | ORAL_TABLET | Freq: Two times a day (BID) | ORAL | 0 refills | Status: DC

## 2018-02-02 MED ORDER — HYDROCODONE-ACETAMINOPHEN 5-325 MG PO TABS
1.0000 | ORAL_TABLET | Freq: Once | ORAL | Status: AC
  Administered 2018-02-02: 1 via ORAL
  Filled 2018-02-02: qty 1

## 2018-02-02 MED ORDER — TETANUS-DIPHTH-ACELL PERTUSSIS 5-2.5-18.5 LF-MCG/0.5 IM SUSP
0.5000 mL | Freq: Once | INTRAMUSCULAR | Status: AC
  Administered 2018-02-02: 0.5 mL via INTRAMUSCULAR
  Filled 2018-02-02: qty 0.5

## 2018-02-02 MED ORDER — CYCLOBENZAPRINE HCL 5 MG PO TABS: 5.0000 mg | ORAL_TABLET | Freq: Two times a day (BID) | ORAL | 0 refills | Status: DC | PRN
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
## 2018-02-08 DIAGNOSIS — Z1389 Encounter for screening for other disorder: Secondary | ICD-10-CM | POA: Diagnosis not present

## 2018-02-08 DIAGNOSIS — Z6827 Body mass index (BMI) 27.0-27.9, adult: Secondary | ICD-10-CM | POA: Diagnosis not present

## 2018-02-08 DIAGNOSIS — R7309 Other abnormal glucose: Secondary | ICD-10-CM | POA: Diagnosis not present

## 2018-02-08 DIAGNOSIS — Z0001 Encounter for general adult medical examination with abnormal findings: Secondary | ICD-10-CM | POA: Diagnosis not present

## 2018-02-08 DIAGNOSIS — S301XXA Contusion of abdominal wall, initial encounter: Secondary | ICD-10-CM | POA: Diagnosis not present

## 2018-02-08 DIAGNOSIS — E663 Overweight: Secondary | ICD-10-CM | POA: Diagnosis not present

## 2018-02-08 DIAGNOSIS — Z0000 Encounter for general adult medical examination without abnormal findings: Secondary | ICD-10-CM | POA: Diagnosis not present

## 2018-10-07 DIAGNOSIS — U071 COVID-19: Secondary | ICD-10-CM | POA: Diagnosis not present

## 2018-10-07 DIAGNOSIS — Z20828 Contact with and (suspected) exposure to other viral communicable diseases: Secondary | ICD-10-CM | POA: Diagnosis not present

## 2018-10-21 DIAGNOSIS — Z20828 Contact with and (suspected) exposure to other viral communicable diseases: Secondary | ICD-10-CM | POA: Diagnosis not present

## 2018-10-21 DIAGNOSIS — U071 COVID-19: Secondary | ICD-10-CM | POA: Diagnosis not present

## 2018-10-28 DIAGNOSIS — Z20828 Contact with and (suspected) exposure to other viral communicable diseases: Secondary | ICD-10-CM | POA: Diagnosis not present

## 2019-05-21 ENCOUNTER — Emergency Department
Admission: EM | Admit: 2019-05-21 | Discharge: 2019-05-21 | Disposition: A | Discharge status: Home / Self Care | Payer: BC Managed Care – PPO | Attending: Emergency Medicine | Admitting: Emergency Medicine

## 2019-05-21 ENCOUNTER — Emergency Department: Payer: BC Managed Care – PPO

## 2019-05-21 ENCOUNTER — Other Ambulatory Visit: Payer: Self-pay

## 2019-05-21 DIAGNOSIS — H524 Presbyopia: Secondary | ICD-10-CM | POA: Diagnosis not present

## 2019-05-21 DIAGNOSIS — Z79899 Other long term (current) drug therapy: Secondary | ICD-10-CM | POA: Insufficient documentation

## 2019-05-21 DIAGNOSIS — H538 Other visual disturbances: Secondary | ICD-10-CM | POA: Diagnosis present

## 2019-05-21 LAB — DIFFERENTIAL
Abs Immature Granulocytes: 0.03 10*3/uL (ref 0.00–0.07)
Basophils Absolute: 0.1 10*3/uL (ref 0.0–0.1)
Basophils Relative: 1 %
Eosinophils Absolute: 0.1 10*3/uL (ref 0.0–0.5)
Eosinophils Relative: 2 %
Immature Granulocytes: 0 %
Lymphocytes Relative: 29 %
Lymphs Abs: 2.1 10*3/uL (ref 0.7–4.0)
Monocytes Absolute: 0.5 10*3/uL (ref 0.1–1.0)
Monocytes Relative: 6 %
Neutro Abs: 4.5 10*3/uL (ref 1.7–7.7)
Neutrophils Relative %: 62 %

## 2019-05-21 LAB — COMPREHENSIVE METABOLIC PANEL
ALT: 43 U/L (ref 0–44)
AST: 34 U/L (ref 15–41)
Albumin: 4.5 g/dL (ref 3.5–5.0)
Alkaline Phosphatase: 84 U/L (ref 38–126)
Anion gap: 9 (ref 5–15)
BUN: 14 mg/dL (ref 6–20)
CO2: 27 mmol/L (ref 22–32)
Calcium: 10 mg/dL (ref 8.9–10.3)
Chloride: 101 mmol/L (ref 98–111)
Creatinine, Ser: 1.03 mg/dL (ref 0.61–1.24)
GFR calc Af Amer: >60 mL/min (ref >60)
GFR calc non Af Amer: >60 mL/min (ref >60)
Glucose, Bld: 176 mg/dL — ABNORMAL HIGH (ref 70–99)
Potassium: 4.3 mmol/L (ref 3.5–5.1)
Sodium: 137 mmol/L (ref 135–145)
Total Bilirubin: 0.5 mg/dL (ref 0.3–1.2)
Total Protein: 7.5 g/dL (ref 6.5–8.1)

## 2019-05-21 LAB — CBC
HCT: 41.6 % (ref 39.0–52.0)
Hemoglobin: 14.1 g/dL (ref 13.0–17.0)
MCH: 30 pg (ref 26.0–34.0)
MCHC: 33.9 g/dL (ref 30.0–36.0)
MCV: 88.5 fL (ref 80.0–100.0)
Platelets: 226 10*3/uL (ref 150–400)
RBC: 4.7 MIL/uL (ref 4.22–5.81)
RDW: 11.9 % (ref 11.5–15.5)
WBC: 7.3 10*3/uL (ref 4.0–10.5)
nRBC: 0 % (ref 0.0–0.2)

## 2019-05-21 LAB — GLUCOSE, CAPILLARY: Glucose-Capillary: 155 mg/dL — ABNORMAL HIGH (ref 70–99)

## 2019-05-21 LAB — PROTIME-INR
INR: 1 (ref 0.8–1.2)
Prothrombin Time: 12.7 seconds (ref 11.4–15.2)

## 2019-05-21 LAB — APTT: aPTT: 28 seconds (ref 24–36)

## 2019-05-21 NOTE — ED Triage Notes (Signed)
Patient c/o blurred vision, intermittent gait abnormality (patient reports he leans to the left when ambulating - not seen on assessment), and intermittent dry mouth beginning Monday. Patient denies dizziness, light-headedness, N/V, SOB.

## 2019-05-21 NOTE — ED Provider Notes (Signed)
[facility] Emergency Department Provider Note  ____________________________________________   First MD Initiated Contact with Patient 05/21/19 [phone]     (approximate)  I have reviewed the triage vital signs and the nursing notes.   HISTORY  Chief Complaint Blurred Vision   HPI Richard Welch is a 49 y.o. male notes to the emergency department stating that on Monday when he was walking he noticed that he was leaning to the left.  Patient states that symptoms completely resolved when he awoke on Tuesday morning.  Patient denies any headache at the time no nausea or vomiting.  Patient also admits to blurred vision when attempting to read objects close to his face however "when I take my glasses off I can see it just fine".  Patient states that his vision at distance is unchanged.  Patient states that he has not been to the optometrist in 1 year        Past Medical History:  Diagnosis Date  . Depression   . Kidney stone     There are no problems to display for this patient.   History reviewed. No pertinent surgical history.  Prior to Admission medications   Medication Sig Start Date End Date Taking? Authorizing Provider  cyclobenzaprine (FLEXERIL) 5 MG tablet Take 1 tablet (5 mg total) by mouth 2 (two) times daily as needed for muscle spasms. 02/02/18   Horton, Barbette Hair, MD  DULoxetine (CYMBALTA) 60 MG capsule Take 60 mg by mouth daily. 11/01/17   [provider]  naproxen (NAPROSYN) 500 MG tablet Take 1 tablet (500 mg total) by mouth 2 (two) times daily. 02/02/18   Horton, Barbette Hair, MD  naproxen sodium (ALEVE) 220 MG tablet Take 220 mg by mouth daily as needed (for pain).    [provider]    Allergies Penicillins and Sulfa antibiotics  No family history on file.  Social History Social History   Tobacco Use  . Smoking status: Never Smoker  . Smokeless tobacco: Never Used  Substance Use Topics  . Alcohol use: No  .  Drug use: No    Review of Systems Constitutional: No fever/chills Eyes: Positive for blurred vision ENT: No sore throat. Cardiovascular: Denies chest pain. Respiratory: Denies shortness of breath. Gastrointestinal: No abdominal pain.  No nausea, no vomiting.  No diarrhea.  No constipation. Genitourinary: Negative for dysuria. Musculoskeletal: Negative for neck pain.  Negative for back pain. Integumentary: Negative for rash. Neurological: Negative for headaches, focal weakness or numbness.  ____________________________________________   PHYSICAL EXAM:  VITAL SIGNS: ED Triage Vitals [05/21/19 0337]  Enc Vitals Group     BP (!) 151/93     Pulse Rate (!) 104     Resp 18     Temp 99 F (37.2 C)     Temp src      SpO2 96 %     Weight 77.6 kg (171 lb)     Height 1.702 m (5\' 7" )     Head Circumference      Peak Flow      Pain Score 0     Pain Loc      Pain Edu?      Excl. in [place]?     Constitutional: Alert and oriented.  Eyes: Conjunctivae are normal.  Mouth/Throat: Patient is wearing a mask. Neck: No stridor.  No meningeal signs.   Cardiovascular: Normal rate, regular rhythm. Good peripheral circulation. Grossly normal heart sounds. Respiratory: Normal respiratory effort.  No retractions. Gastrointestinal:  Soft and nontender. No distention.  Musculoskeletal: No lower extremity tenderness nor edema. No gross deformities of extremities. Neurologic:  Normal speech and language. No gross focal neurologic deficits are appreciated.  Ambulated with no abnormality noted Skin:  Skin is warm, dry and intact. Psychiatric: Mood and affect are normal. Speech and behavior are normal.  ____________________________________________   LABS (all labs ordered are listed, but only abnormal results are displayed)  Labs Reviewed  GLUCOSE, CAPILLARY - Abnormal; Notable for the following components:      Result Value   Glucose-Capillary 155 (*)    All other components within normal limits    COMPREHENSIVE METABOLIC PANEL - Abnormal; Notable for the following components:   Glucose, Bld 176 (*)    All other components within normal limits  PROTIME-INR  APTT  CBC  DIFFERENTIAL  CBG MONITORING, ED   __________________  RADIOLOGY I, [place] Dewayne Shorter, personally viewed and evaluated these images (plain radiographs) as part of my medical decision making, as well as reviewing the written report by the radiologist.  ED MD interpretation: Normal CT scan of the head per radiologist  Official radiology report(s): CT HEAD WO CONTRAST  Result Date: 05/21/2019 CLINICAL DATA:  Blurry vision and gait abnormality. EXAM: CT HEAD WITHOUT CONTRAST TECHNIQUE: Contiguous axial images were obtained from the base of the skull through the vertex without intravenous contrast. COMPARISON:  None. FINDINGS: Brain: There is no mass, hemorrhage or extra-axial collection. The size and configuration of the ventricles and extra-axial CSF spaces are normal. The brain parenchyma is normal, without acute or chronic infarction. Vascular: No abnormal hyperdensity of the major intracranial arteries or dural venous sinuses. No intracranial atherosclerosis. Skull: The visualized skull base, calvarium and extracranial soft tissues are normal. Sinuses/Orbits: No fluid levels or advanced mucosal thickening of the visualized paranasal sinuses. No mastoid or middle ear effusion. The orbits are normal. IMPRESSION: Normal head CT. Electronically Signed   By: Deatra Robinson M.D.   On: 05/21/2019 04:23      Procedures   ____________________________________________   INITIAL IMPRESSION / MDM / ASSESSMENT AND PLAN / ED COURSE  As part of my medical decision making, I reviewed the following data within the electronic MEDICAL RECORD NUMBER   49 year old male presented with above-stated history and physical exam with differential diagnosis including presbyopia and less likely TIA/CVA.  CT scan of the head revealed no acute  intracranial abnormality.  Patient advised to follow-up with optometrist  ____________________________________________  FINAL CLINICAL IMPRESSION(S) / ED DIAGNOSES  Final diagnoses:  Presbyopia of both eyes     MEDICATIONS GIVEN DURING THIS VISIT:  Medications - No data to display   ED Discharge Orders    None      *Please note:  Richard Welch was evaluated in Emergency Department on 05/21/2019 for the symptoms described in the history of present illness. He was evaluated in the context of the global COVID-19 pandemic, which necessitated consideration that the patient might be at risk for infection with the SARS-CoV-2 virus that causes COVID-19. Institutional protocols and algorithms that pertain to the evaluation of patients at risk for COVID-19 are in a state of rapid change based on information released by regulatory bodies including the CDC and federal and state organizations. These policies and algorithms were followed during the patient's care in the ED.  Some ED evaluations and interventions may be delayed as a result of limited staffing during the pandemic.*  Note:  This document was prepared using Conservation officer, historic buildings  and may include unintentional dictation errors.   Darci Current, MD 05/21/19 [phone]

## 2019-05-21 NOTE — ED Notes (Signed)
PT attempted to sign d/c consent but topaz not working.

## 2019-06-01 ENCOUNTER — Other Ambulatory Visit: Payer: Self-pay

## 2019-06-01 ENCOUNTER — Emergency Department (HOSPITAL_COMMUNITY): Payer: BC Managed Care – PPO

## 2019-06-01 ENCOUNTER — Encounter (HOSPITAL_COMMUNITY): Payer: Self-pay

## 2019-06-01 ENCOUNTER — Inpatient Hospital Stay (HOSPITAL_COMMUNITY)
Admission: EM | Admit: 2019-06-01 | Discharge: 2019-06-05 | DRG: 918 | Disposition: A | Discharge status: Other Acute Inpatient Hospital | Payer: BC Managed Care – PPO | Attending: Internal Medicine | Admitting: Internal Medicine

## 2019-06-01 DIAGNOSIS — T450X2A Poisoning by antiallergic and antiemetic drugs, intentional self-harm, initial encounter: Secondary | ICD-10-CM | POA: Diagnosis present

## 2019-06-01 DIAGNOSIS — Z880 Allergy status to penicillin: Secondary | ICD-10-CM

## 2019-06-01 DIAGNOSIS — Z882 Allergy status to sulfonamides status: Secondary | ICD-10-CM

## 2019-06-01 DIAGNOSIS — Z20822 Contact with and (suspected) exposure to covid-19: Secondary | ICD-10-CM | POA: Diagnosis present

## 2019-06-01 DIAGNOSIS — T450X4A Poisoning by antiallergic and antiemetic drugs, undetermined, initial encounter: Secondary | ICD-10-CM

## 2019-06-01 DIAGNOSIS — T43222A Poisoning by selective serotonin reuptake inhibitors, intentional self-harm, initial encounter: Secondary | ICD-10-CM | POA: Diagnosis not present

## 2019-06-01 DIAGNOSIS — T450X1A Poisoning by antiallergic and antiemetic drugs, accidental (unintentional), initial encounter: Secondary | ICD-10-CM | POA: Diagnosis not present

## 2019-06-01 DIAGNOSIS — T43201A Poisoning by unspecified antidepressants, accidental (unintentional), initial encounter: Secondary | ICD-10-CM | POA: Diagnosis present

## 2019-06-01 DIAGNOSIS — R739 Hyperglycemia, unspecified: Secondary | ICD-10-CM | POA: Diagnosis present

## 2019-06-01 DIAGNOSIS — K219 Gastro-esophageal reflux disease without esophagitis: Secondary | ICD-10-CM

## 2019-06-01 DIAGNOSIS — T43622A Poisoning by amphetamines, intentional self-harm, initial encounter: Secondary | ICD-10-CM | POA: Diagnosis present

## 2019-06-01 DIAGNOSIS — D72829 Elevated white blood cell count, unspecified: Secondary | ICD-10-CM | POA: Diagnosis present

## 2019-06-01 DIAGNOSIS — F3130 Bipolar disorder, current episode depressed, mild or moderate severity, unspecified: Secondary | ICD-10-CM | POA: Diagnosis present

## 2019-06-01 DIAGNOSIS — R4182 Altered mental status, unspecified: Secondary | ICD-10-CM | POA: Diagnosis present

## 2019-06-01 DIAGNOSIS — R330 Drug induced retention of urine: Secondary | ICD-10-CM | POA: Diagnosis present

## 2019-06-01 DIAGNOSIS — T43224A Poisoning by selective serotonin reuptake inhibitors, undetermined, initial encounter: Secondary | ICD-10-CM

## 2019-06-01 DIAGNOSIS — J029 Acute pharyngitis, unspecified: Secondary | ICD-10-CM | POA: Diagnosis present

## 2019-06-01 DIAGNOSIS — E119 Type 2 diabetes mellitus without complications: Secondary | ICD-10-CM

## 2019-06-01 DIAGNOSIS — E1165 Type 2 diabetes mellitus with hyperglycemia: Secondary | ICD-10-CM | POA: Diagnosis present

## 2019-06-01 DIAGNOSIS — T43221A Poisoning by selective serotonin reuptake inhibitors, accidental (unintentional), initial encounter: Secondary | ICD-10-CM

## 2019-06-01 LAB — CBG MONITORING, ED: Glucose-Capillary: 137 mg/dL — ABNORMAL HIGH (ref 70–99)

## 2019-06-01 MED ORDER — LORAZEPAM 2 MG/ML IJ SOLN
1.0000 mg | Freq: Once | INTRAMUSCULAR | Status: AC
  Administered 2019-06-01: 1 mg via INTRAVENOUS
  Filled 2019-06-01: qty 1

## 2019-06-01 MED ORDER — LEVETIRACETAM IN NACL 1000 MG/100ML IV SOLN
1000.0000 mg | Freq: Once | INTRAVENOUS | Status: AC
  Administered 2019-06-02: 1000 mg via INTRAVENOUS
  Filled 2019-06-01: qty 100

## 2019-06-01 NOTE — ED Triage Notes (Addendum)
Per EMS pt has had 3 episodes of near syncope and slurred speech along with AMS in the past 3 weeks. Pt seen at [facility] for stroke workup.  Pt responsive to painful stimuli upon arrival to [facility] ED. Pt experiencing jerking motions at this time. Pupils pinpoint.   Pt wife denies any medical hx for pt.   5mg  Versed given en route to [facility] by EMS
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
## 2019-06-02 ENCOUNTER — Encounter (HOSPITAL_COMMUNITY): Payer: Self-pay | Admitting: Internal Medicine

## 2019-06-02 ENCOUNTER — Other Ambulatory Visit: Payer: Self-pay

## 2019-06-02 DIAGNOSIS — F3130 Bipolar disorder, current episode depressed, mild or moderate severity, unspecified: Secondary | ICD-10-CM | POA: Diagnosis present

## 2019-06-02 DIAGNOSIS — T450X4A Poisoning by antiallergic and antiemetic drugs, undetermined, initial encounter: Secondary | ICD-10-CM | POA: Diagnosis not present

## 2019-06-02 DIAGNOSIS — T450X2A Poisoning by antiallergic and antiemetic drugs, intentional self-harm, initial encounter: Secondary | ICD-10-CM

## 2019-06-02 DIAGNOSIS — K219 Gastro-esophageal reflux disease without esophagitis: Secondary | ICD-10-CM | POA: Diagnosis present

## 2019-06-02 DIAGNOSIS — D72829 Elevated white blood cell count, unspecified: Secondary | ICD-10-CM | POA: Diagnosis present

## 2019-06-02 DIAGNOSIS — T43622A Poisoning by amphetamines, intentional self-harm, initial encounter: Secondary | ICD-10-CM | POA: Diagnosis present

## 2019-06-02 DIAGNOSIS — T43202A Poisoning by unspecified antidepressants, intentional self-harm, initial encounter: Secondary | ICD-10-CM

## 2019-06-02 DIAGNOSIS — Z20822 Contact with and (suspected) exposure to covid-19: Secondary | ICD-10-CM | POA: Diagnosis present

## 2019-06-02 DIAGNOSIS — R330 Drug induced retention of urine: Secondary | ICD-10-CM | POA: Diagnosis present

## 2019-06-02 DIAGNOSIS — T43224A Poisoning by selective serotonin reuptake inhibitors, undetermined, initial encounter: Secondary | ICD-10-CM | POA: Diagnosis not present

## 2019-06-02 DIAGNOSIS — T43201A Poisoning by unspecified antidepressants, accidental (unintentional), initial encounter: Secondary | ICD-10-CM | POA: Diagnosis present

## 2019-06-02 DIAGNOSIS — R739 Hyperglycemia, unspecified: Secondary | ICD-10-CM | POA: Diagnosis present

## 2019-06-02 DIAGNOSIS — Z880 Allergy status to penicillin: Secondary | ICD-10-CM | POA: Diagnosis not present

## 2019-06-02 DIAGNOSIS — T43222A Poisoning by selective serotonin reuptake inhibitors, intentional self-harm, initial encounter: Secondary | ICD-10-CM | POA: Diagnosis present

## 2019-06-02 DIAGNOSIS — Z882 Allergy status to sulfonamides status: Secondary | ICD-10-CM | POA: Diagnosis not present

## 2019-06-02 DIAGNOSIS — T450X1A Poisoning by antiallergic and antiemetic drugs, accidental (unintentional), initial encounter: Secondary | ICD-10-CM | POA: Diagnosis present

## 2019-06-02 DIAGNOSIS — T43204D Poisoning by unspecified antidepressants, undetermined, subsequent encounter: Secondary | ICD-10-CM | POA: Diagnosis not present

## 2019-06-02 DIAGNOSIS — E119 Type 2 diabetes mellitus without complications: Secondary | ICD-10-CM | POA: Diagnosis not present

## 2019-06-02 DIAGNOSIS — J029 Acute pharyngitis, unspecified: Secondary | ICD-10-CM | POA: Diagnosis present

## 2019-06-02 DIAGNOSIS — E1165 Type 2 diabetes mellitus with hyperglycemia: Secondary | ICD-10-CM | POA: Diagnosis present

## 2019-06-02 DIAGNOSIS — F4329 Adjustment disorder with other symptoms: Secondary | ICD-10-CM | POA: Diagnosis not present

## 2019-06-02 DIAGNOSIS — R4182 Altered mental status, unspecified: Secondary | ICD-10-CM | POA: Diagnosis present

## 2019-06-02 DIAGNOSIS — F332 Major depressive disorder, recurrent severe without psychotic features: Secondary | ICD-10-CM | POA: Diagnosis not present

## 2019-06-02 LAB — URINALYSIS, ROUTINE W REFLEX MICROSCOPIC
Bilirubin Urine: NEGATIVE
Glucose, UA: 50 mg/dL — AB
Hgb urine dipstick: NEGATIVE
Ketones, ur: NEGATIVE mg/dL
Leukocytes,Ua: NEGATIVE
Nitrite: NEGATIVE
Protein, ur: NEGATIVE mg/dL
Specific Gravity, Urine: 1.019 (ref 1.005–1.030)
pH: 6 (ref 5.0–8.0)

## 2019-06-02 LAB — CBC WITH DIFFERENTIAL/PLATELET
Abs Immature Granulocytes: 0.04 10*3/uL (ref 0.00–0.07)
Basophils Absolute: 0.1 10*3/uL (ref 0.0–0.1)
Basophils Relative: 0 %
Eosinophils Absolute: 0.1 10*3/uL (ref 0.0–0.5)
Eosinophils Relative: 0 %
HCT: 43.2 % (ref 39.0–52.0)
Hemoglobin: 14.1 g/dL (ref 13.0–17.0)
Immature Granulocytes: 0 %
Lymphocytes Relative: 17 %
Lymphs Abs: 2.3 10*3/uL (ref 0.7–4.0)
MCH: 30.5 pg (ref 26.0–34.0)
MCHC: 32.6 g/dL (ref 30.0–36.0)
MCV: 93.5 fL (ref 80.0–100.0)
Monocytes Absolute: 0.7 10*3/uL (ref 0.1–1.0)
Monocytes Relative: 5 %
Neutro Abs: 10.1 10*3/uL — ABNORMAL HIGH (ref 1.7–7.7)
Neutrophils Relative %: 78 %
Platelets: 240 10*3/uL (ref 150–400)
RBC: 4.62 MIL/uL (ref 4.22–5.81)
RDW: 11.9 % (ref 11.5–15.5)
WBC: 13.2 10*3/uL — ABNORMAL HIGH (ref 4.0–10.5)
nRBC: 0 % (ref 0.0–0.2)

## 2019-06-02 LAB — RAPID URINE DRUG SCREEN, HOSP PERFORMED
Amphetamines: NOT DETECTED
Barbiturates: NOT DETECTED
Benzodiazepines: POSITIVE — AB
Cocaine: NOT DETECTED
Opiates: NOT DETECTED
Tetrahydrocannabinol: NOT DETECTED

## 2019-06-02 LAB — COMPREHENSIVE METABOLIC PANEL
ALT: 49 U/L — ABNORMAL HIGH (ref 0–44)
ALT: 57 U/L — ABNORMAL HIGH (ref 0–44)
AST: 33 U/L (ref 15–41)
AST: 39 U/L (ref 15–41)
Albumin: 4.4 g/dL (ref 3.5–5.0)
Albumin: 4.6 g/dL (ref 3.5–5.0)
Alkaline Phosphatase: 70 U/L (ref 38–126)
Alkaline Phosphatase: 77 U/L (ref 38–126)
Anion gap: 11 (ref 5–15)
Anion gap: 12 (ref 5–15)
BUN: 13 mg/dL (ref 6–20)
BUN: 14 mg/dL (ref 6–20)
CO2: 23 mmol/L (ref 22–32)
CO2: 24 mmol/L (ref 22–32)
Calcium: 8.9 mg/dL (ref 8.9–10.3)
Calcium: 9.1 mg/dL (ref 8.9–10.3)
Chloride: 101 mmol/L (ref 98–111)
Chloride: 102 mmol/L (ref 98–111)
Creatinine, Ser: 0.95 mg/dL (ref 0.61–1.24)
Creatinine, Ser: 0.96 mg/dL (ref 0.61–1.24)
GFR calc Af Amer: >60 mL/min (ref >60)
GFR calc Af Amer: >60 mL/min (ref >60)
GFR calc non Af Amer: >60 mL/min (ref >60)
GFR calc non Af Amer: >60 mL/min (ref >60)
Glucose, Bld: 166 mg/dL — ABNORMAL HIGH (ref 70–99)
Glucose, Bld: 175 mg/dL — ABNORMAL HIGH (ref 70–99)
Potassium: 3.8 mmol/L (ref 3.5–5.1)
Potassium: 4.3 mmol/L (ref 3.5–5.1)
Sodium: 136 mmol/L (ref 135–145)
Sodium: 137 mmol/L (ref 135–145)
Total Bilirubin: 0.7 mg/dL (ref 0.3–1.2)
Total Bilirubin: 1 mg/dL (ref 0.3–1.2)
Total Protein: 7.3 g/dL (ref 6.5–8.1)
Total Protein: 7.6 g/dL (ref 6.5–8.1)

## 2019-06-02 LAB — SARS CORONAVIRUS 2 (TAT 6-24 HRS): SARS Coronavirus 2: NEGATIVE

## 2019-06-02 LAB — CBC
HCT: 39.3 % (ref 39.0–52.0)
Hemoglobin: 13.3 g/dL (ref 13.0–17.0)
MCH: 30.7 pg (ref 26.0–34.0)
MCHC: 33.8 g/dL (ref 30.0–36.0)
MCV: 90.8 fL (ref 80.0–100.0)
Platelets: 230 10*3/uL (ref 150–400)
RBC: 4.33 MIL/uL (ref 4.22–5.81)
RDW: 11.8 % (ref 11.5–15.5)
WBC: 12.1 10*3/uL — ABNORMAL HIGH (ref 4.0–10.5)
nRBC: 0 % (ref 0.0–0.2)

## 2019-06-02 LAB — SALICYLATE LEVEL: Salicylate Lvl: <7 mg/dL — ABNORMAL LOW (ref 7.0–30.0)

## 2019-06-02 LAB — ETHANOL: Alcohol, Ethyl (B): <10 mg/dL (ref <10)

## 2019-06-02 LAB — HEMOGLOBIN A1C
Hgb A1c MFr Bld: 6.5 % — ABNORMAL HIGH (ref 4.8–5.6)
Mean Plasma Glucose: 139.85 mg/dL

## 2019-06-02 LAB — ACETAMINOPHEN LEVEL: Acetaminophen (Tylenol), Serum: <10 ug/mL — ABNORMAL LOW (ref 10–30)

## 2019-06-02 LAB — MAGNESIUM: Magnesium: 1.9 mg/dL (ref 1.7–2.4)

## 2019-06-02 MED ORDER — CHLORHEXIDINE GLUCONATE CLOTH 2 % EX PADS
6.0000 | MEDICATED_PAD | Freq: Every day | CUTANEOUS | Status: DC
  Administered 2019-06-02 – 2019-06-03 (×2): 6 via TOPICAL

## 2019-06-02 MED ORDER — SODIUM CHLORIDE 0.9 % IV SOLN: INTRAVENOUS | Status: DC

## 2019-06-02 MED ORDER — LORAZEPAM 2 MG/ML IJ SOLN
1.0000 mg | Freq: Once | INTRAMUSCULAR | Status: AC
  Administered 2019-06-02: 1 mg via INTRAVENOUS
  Filled 2019-06-02: qty 1

## 2019-06-02 MED ORDER — ACETAMINOPHEN 325 MG PO TABS: 650.0000 mg | ORAL_TABLET | Freq: Four times a day (QID) | ORAL | Status: DC | PRN

## 2019-06-02 MED ORDER — PANTOPRAZOLE SODIUM 40 MG IV SOLR
40.0000 mg | INTRAVENOUS | Status: DC
  Administered 2019-06-02 – 2019-06-03 (×2): 40 mg via INTRAVENOUS
  Filled 2019-06-02 (×2): qty 40

## 2019-06-02 MED ORDER — ACETAMINOPHEN 650 MG RE SUPP
650.0000 mg | Freq: Four times a day (QID) | RECTAL | Status: DC | PRN
  Administered 2019-06-02: 15:00:00 650 mg via RECTAL
  Filled 2019-06-02: qty 1

## 2019-06-02 MED ORDER — HEPARIN SODIUM (PORCINE) 5000 UNIT/ML IJ SOLN
5000.0000 [IU] | Freq: Three times a day (TID) | INTRAMUSCULAR | Status: DC
  Administered 2019-06-02 – 2019-06-05 (×10): 5000 [IU] via SUBCUTANEOUS
  Filled 2019-06-02 (×10): qty 1

## 2019-06-02 MED ORDER — LORAZEPAM 2 MG/ML IJ SOLN
1.0000 mg | Freq: Once | INTRAMUSCULAR | Status: AC
  Administered 2019-06-02: 04:00:00 1 mg via INTRAVENOUS
  Filled 2019-06-02: qty 1

## 2019-06-02 MED ORDER — METOPROLOL TARTRATE 5 MG/5ML IV SOLN
5.0000 mg | Freq: Once | INTRAVENOUS | Status: AC
  Administered 2019-06-02: 09:00:00 5 mg via INTRAVENOUS
  Filled 2019-06-02: qty 5

## 2019-06-02 MED ORDER — LORAZEPAM 2 MG/ML IJ SOLN: 1.0000 mg | Freq: Once | INTRAMUSCULAR | Status: DC

## 2019-06-02 MED ORDER — SODIUM CHLORIDE 0.9 % IV BOLUS
1000.0000 mL | Freq: Once | INTRAVENOUS | Status: AC
  Administered 2019-06-02: 03:00:00 1000 mL via INTRAVENOUS
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
## 2019-06-02 NOTE — Progress Notes (Signed)
Pt is resting in bed. He occasionally will sit up or toss and turn in bed but is not trying to get out of bed. Obtained orders for soft wrist restraints due to patient pulled safety mitts off and threw them. Have not had to implement them at this point. Continuing to monitor patient. Safety sitter in room with patient also.

## 2019-06-02 NOTE — ED Notes (Signed)
Pt with AMS. Admission Assessment gleaned from Wife, Richard Welch via [employer]

## 2019-06-02 NOTE — Progress Notes (Signed)
Seen and examined.  Admitted after midnight secondary to suicidal intent and antihistamine overdose.  Currently meeting SIRS criteria, with acute urinary retention and very obtunded.  No fever.  Foley catheter has been placed.  We will continue IV fluids and use as needed beta-blocker.  GI prophylaxis with PPI will be also started.  Sitter at bedside requested.  Please refer to H&P written by Dr. Robb Matar on 06/02/2019 for further info/details on admission.  Once cognitively improve will require psychiatric evaluation most likely at discharge inpatient psych facility.  Vassie Loll MD [phone]

## 2019-06-02 NOTE — ED Notes (Signed)
Pt disconnected foley from bag.  Foley catheter removed without difficulty or injury.

## 2019-06-02 NOTE — ED Notes (Signed)
Patient had crawled out of bed and pulled his IV out.  Patient and room cleaned.  Patient has seizure pads to assist with keeping him in the bed.

## 2019-06-02 NOTE — ED Notes (Signed)
Soft restraints removed.  Pt pulled IV out. Pt is restless.

## 2019-06-02 NOTE — ED Notes (Signed)
Spoke to [place] regarding pt transfer to ICU.

## 2019-06-02 NOTE — Progress Notes (Signed)
Patient presented to unit confused, A/Ox2 only, but in stable condition. Restraints not present upon patient's arrival to unit. Full restraint assessment performed and MD made aware that patient has been removed from bilateral wrist restraints. Order discontinued.

## 2019-06-02 NOTE — ED Notes (Signed)
Received follow-up call from Poison control.

## 2019-06-02 NOTE — ED Notes (Signed)
Richard Welch  [phone]

## 2019-06-02 NOTE — H&P (Signed)
History and Physical    Richard Welch XVQ:[id number] DOB: 08-18-1970 DOA: 06/01/2019  PCP: Anola Gurney, PA   Patient coming from: Home.  I have personally briefly reviewed patient's old medical records in [facility]  Chief Complaint: Overdose.  HPI: Richard Welch is a 49 y.o. male with medical history significant of bipolar disorder/depression and urolithiasis who is brought to the emergency department due to somnolence following an ingestion of an unknown amount of duloxetine and possibly a bottle of diphenhydramine after getting angry and upset at his wife because they have been separated.  He has been making threats to "take a bunch of pills".  He is unable to provide further history as he is sedated and somnolent.  ED Course: The patient had a urinary retention, which is likely due to the diphenhydramine.  A Foley catheter was placed.  His CBC showed a white count of 13.2 with 78% neutrophils 17% lymphocytes.  Hemoglobin 14.1 g/dL and platelets 093.  CMP shows a glucose of 166 mg/dL and ALT of 57 units/L.  All other values are normal.  Urinalysis has mild glucosuria 50 mg/dL.  EtOH, acetaminophen and salicylate were normal.  CT head was motion degraded, but no acute intracranial pathology was seen.  Review of Systems: As per HPI otherwise 10 point review of systems negative.   Past Medical History:  Diagnosis Date  . Depression   . Kidney stone     History reviewed. No pertinent surgical history.   reports that he has never smoked. He has never used smokeless tobacco. He reports that he does not drink alcohol or use drugs.  Allergies  Allergen Reactions  . Penicillins Rash    Has patient had a PCN reaction causing immediate rash, facial/tongue/throat swelling, SOB or lightheadedness with hypotension: Yes Has patient had a PCN reaction causing severe rash involving mucus membranes or skin necrosis: Yes Has patient had a PCN reaction that required  hospitalization: No Has patient had a PCN reaction occurring within the last 10 years: No If all of the above answers are "NO", then may proceed with Cephalosporin use.   . Sulfa Antibiotics Rash   Unable to obtain family medical history.  Prior to Admission medications   Medication Sig Start Date End Date Taking? Authorizing Provider  cyclobenzaprine (FLEXERIL) 5 MG tablet Take 1 tablet (5 mg total) by mouth 2 (two) times daily as needed for muscle spasms. 02/02/18   Horton, Mayer Masker, MD  DULoxetine (CYMBALTA) 60 MG capsule Take 60 mg by mouth daily. 11/01/17   [provider]  naproxen (NAPROSYN) 500 MG tablet Take 1 tablet (500 mg total) by mouth 2 (two) times daily. 02/02/18   Horton, Mayer Masker, MD  naproxen sodium (ALEVE) 220 MG tablet Take 220 mg by mouth daily as needed (for pain).    [provider]    Physical Exam: Vitals:   06/02/19 0320 06/02/19 0331 06/02/19 0500 06/02/19 0530  BP: 135/90 137/78 134/85 (!) 146/81  Pulse: (!) 119 (!) 118 (!) 117 (!) 117  Resp: (!) 26 (!) 30 (!) 26 20  Temp:      TempSrc:      SpO2: 98% 99% 99% 96%  Weight:      Height:        Constitutional: Sedated. Eyes: PERRL, lids and conjunctivae mildly injected. ENMT: Mucous membranes are dry.  Posterior pharynx clear of any exudate or lesions.Normal dentition.  Neck: normal, supple, no masses, no thyromegaly Respiratory: clear to auscultation  bilaterally, no wheezing, no crackles. Normal respiratory effort. No accessory muscle use.  Cardiovascular: Regular rate and rhythm, no murmurs / rubs / gallops. No extremity edema. 2+ pedal pulses. No carotid bruits.  Abdomen: Nondistended.  Soft, no tenderness, no masses palpated. No hepatosplenomegaly. Bowel sounds positive.  Musculoskeletal: no clubbing / cyanosis.Good ROM, no contractures.  Relaxed muscle tone.  Skin: no rashes, lesions, ulcers on very limited dermatological examination. Neurologic: Sedated.  Somnolent.  Looks  nonfocal.  Unable to fully evaluate. Psychiatric: Somnolent.  Labs on Admission: I have personally reviewed following labs and imaging studies  CBC: Recent Labs  Lab 06/01/19 2356  WBC 13.2*  NEUTROABS 10.1*  HGB 14.1  HCT 43.2  MCV 93.5  PLT 240   Basic Metabolic Panel: Recent Labs  Lab 06/01/19 2356  NA 137  K 4.3  CL 102  CO2 23  GLUCOSE 166*  BUN 14  CREATININE 0.95  CALCIUM 9.1  MG 1.9   GFR: Estimated Creatinine Clearance: 90.8 mL/min (by C-G formula based on SCr of 0.95 mg/dL). Liver Function Tests: Recent Labs  Lab 06/01/19 2356  AST 39  ALT 57*  ALKPHOS 77  BILITOT 0.7  PROT 7.6  ALBUMIN 4.6   No results for input(s): LIPASE, AMYLASE in the last 168 hours. No results for input(s): AMMONIA in the last 168 hours. Coagulation Profile: No results for input(s): INR, PROTIME in the last 168 hours. Cardiac Enzymes: No results for input(s): CKTOTAL, CKMB, CKMBINDEX, TROPONINI in the last 168 hours. BNP (last 3 results) No results for input(s): PROBNP in the last 8760 hours. HbA1C: No results for input(s): HGBA1C in the last 72 hours. CBG: Recent Labs  Lab 06/01/19 2332  GLUCAP 137*   Lipid Profile: No results for input(s): CHOL, HDL, LDLCALC, TRIG, CHOLHDL, LDLDIRECT in the last 72 hours. Thyroid Function Tests: No results for input(s): TSH, T4TOTAL, FREET4, T3FREE, THYROIDAB in the last 72 hours. Anemia Panel: No results for input(s): VITAMINB12, FOLATE, FERRITIN, TIBC, IRON, RETICCTPCT in the last 72 hours. Urine analysis:    Component Value Date/Time   COLORURINE YELLOW 06/02/2019 0125   APPEARANCEUR CLEAR 06/02/2019 0125   APPEARANCEUR Clear 04/10/2014 2055   LABSPEC 1.019 06/02/2019 0125   LABSPEC 1.021 04/10/2014 2055   PHURINE 6.0 06/02/2019 0125   GLUCOSEU 50 (A) 06/02/2019 0125   GLUCOSEU 150 mg/dL 52/84/1324 4010   HGBUR NEGATIVE 06/02/2019 0125   BILIRUBINUR NEGATIVE 06/02/2019 0125   BILIRUBINUR Negative 04/10/2014 2055    KETONESUR NEGATIVE 06/02/2019 0125   PROTEINUR NEGATIVE 06/02/2019 0125   UROBILINOGEN 0.2 02/28/2014 2256   NITRITE NEGATIVE 06/02/2019 0125   LEUKOCYTESUR NEGATIVE 06/02/2019 0125   LEUKOCYTESUR Negative 04/10/2014 2055    Radiological Exams on Admission: CT Head Wo Contrast  Result Date: 06/02/2019 CLINICAL DATA:  Altered mental status. Possible seizure. Technologist notes state constant head movement. EXAM: CT HEAD WITHOUT CONTRAST TECHNIQUE: Contiguous axial images were obtained from the base of the skull through the vertex without intravenous contrast. COMPARISON:  Head CT 05/21/2019 FINDINGS: Brain: Moderate motion artifact, despite repeat acquisition. Allowing for this, no evidence of acute infarct, hemorrhage, hydrocephalus, midline shift or mass effect. Vascular: No obvious hyperdense vessel. Skull: Negative allowing for motion. Sinuses/Orbits: No acute findings. Other: None. IMPRESSION: Motion limited exam without evidence of acute intracranial abnormality. Electronically Signed   By: Narda Rutherford M.D.   On: 06/02/2019 01:03    EKG: Independently reviewed. Vent. rate 121 BPM PR interval * ms QRS duration 75 ms QT/QTc 309/439  ms P-R-T axes 68 88 -10 Sinus tachycardia Ventricular premature complex Aberrant conduction of SV complex(es) Borderline T abnormalities, inferior leads  Assessment/Plan Principal Problem:   Antihistamines overdose Observation/stepdown. Frequent neuro checks. Continue IV fluids. Monitor for arrhythmias.  Active Problems:   Antidepressant overdose Continue IV fluids. Keep electrolytes optimized. Frequent neuro checks. Consult BH once the patient is awake.    Leukocytosis No fever. Likely stress-induced. Follow WBC.    Hyperglycemia Follow-up glucose level.   DVT prophylaxis: Lovenox SQ Code Status: Full code Family Communication: Disposition Plan: Observation for duloxetine and definitive remaining overdose. Consults  called: Admission status: Stepdown/observation.   Reubin Milan MD [facility]  If 7PM-7AM, please contact night-coverage [email or web address]  06/02/2019, 5:50 AM

## 2019-06-03 DIAGNOSIS — D72829 Elevated white blood cell count, unspecified: Secondary | ICD-10-CM

## 2019-06-03 DIAGNOSIS — T43204D Poisoning by unspecified antidepressants, undetermined, subsequent encounter: Secondary | ICD-10-CM

## 2019-06-03 DIAGNOSIS — R739 Hyperglycemia, unspecified: Secondary | ICD-10-CM

## 2019-06-03 DIAGNOSIS — T450X4A Poisoning by antiallergic and antiemetic drugs, undetermined, initial encounter: Secondary | ICD-10-CM

## 2019-06-03 LAB — HIV ANTIBODY (ROUTINE TESTING W REFLEX): HIV Screen 4th Generation wRfx: NONREACTIVE

## 2019-06-03 LAB — MRSA PCR SCREENING: MRSA by PCR: NEGATIVE

## 2019-06-03 MED ORDER — PANTOPRAZOLE SODIUM 40 MG PO TBEC
40.0000 mg | DELAYED_RELEASE_TABLET | Freq: Every day | ORAL | Status: DC
  Administered 2019-06-04 – 2019-06-05 (×2): 40 mg via ORAL
  Filled 2019-06-03 (×2): qty 1

## 2019-06-03 NOTE — Progress Notes (Signed)
Follow-up phone call received from Poison Control. This RN updated Poison Control on patient's status with current VS, Poison Control has signed off on patient. No follow-up required per [facility].  Quita Skye, RN

## 2019-06-03 NOTE — Progress Notes (Signed)
Pt wife called requesting updates. She expressed that the patient has chronically struggled with maintaining his medications, he has attempted suicide several times. She also expressed how unsafe she and their daughter feel with him and the threats he's made to them. She expressed concern for the safety of the staff when he wakes up. She does ask that she be notified if he is pending discharge.

## 2019-06-03 NOTE — Progress Notes (Signed)
PROGRESS NOTE    Richard Welch  PPI:[id number] DOB: 1970/08/11 DOA: 06/01/2019 PCP: Anola Gurney, PA     Brief Narrative:  As per H&P written by Dr. Robb Matar on 06/02/2019 49 y.o. male with medical history significant of bipolar disorder/depression and urolithiasis who is brought to the emergency department due to somnolence following an ingestion of an unknown amount of duloxetine and possibly a bottle of diphenhydramine after getting angry and upset at his wife because they have been separated.  He has been making threats to "take a bunch of pills".  He is unable to provide further history as he is sedated and somnolent.  ED Course: The patient had a urinary retention, which is likely due to the diphenhydramine.  A Foley catheter was placed.  Assessment & Plan: 1-bipolar disorder with prior history of suicidal attempt: Presented with medication overdose. -Drug of choice antihistamines (Benadryl) and Cymbalta. -After supportive care patient mentation has improved and is at this moment able to follow commands appropriately and appears oriented x3. -Denies suicidal ideation or hallucination at this time. -Safe to move out of a stepdown bed, advance diet and request psychiatry consultation. -Continue supportive care.  2-Leukocytosis -No acute source of infection identified -Most likely stress demargination -Patient having SIRS on presentation, improved currently -WBCs trending down.  3-Hyperglycemia -A1c 6.5 -Lifestyle changes and modified carbohydrate diet recommended -Looking to proceed with PCP initiation of hypoglycemic medications.  4-GERD/GI prophylaxis -Continue PPI   DVT prophylaxis: Heparin Code Status: Full code Family Communication: No family at bedside. Disposition Plan: Remains inpatient.  Follow psychiatry consultation.  Safe to advance diet and transfer to MedSurg bed.  Corporate investment banker.  Consultants:   Psychiatry service.  Procedures:   See below for  x-ray reports.  Antimicrobials:  Anti-infectives (From admission, onward)   None      Subjective: Oriented x3, no chest pain, no shortness of breath, no nausea, no vomiting.  Denies suicidal ideation currently.  Objective: Vitals:   06/03/19 0900 06/03/19 1000 06/03/19 1100 06/03/19 1141  BP: 131/83 122/81 129/79   Pulse: 90 82 86   Resp: (!) 22 15 (!) 21   Temp:    98.7 F (37.1 C)  TempSrc:    Oral  SpO2: 97% 98% 99%   Weight:      Height:        Intake/Output Summary (Last 24 hours) at 06/03/2019 1323 Last data filed at 06/03/2019 1249 Gross per 24 hour  Intake 2839.13 ml  Output 1200 ml  Net 1639.13 ml   Filed Weights   06/01/19 2313 06/02/19 1325  Weight: 77 kg 77.6 kg    Examination: General exam: Alert, awake, oriented x 3, no fever, no chest pain, no nausea, no vomiting.  Patient denies abdominal pain, urinary retention, focal weakness or any other complaints.  Currently without suicidal ideation or hallucinations. Respiratory system: Clear to auscultation. Respiratory effort normal. Cardiovascular system:RRR. No murmurs, rubs, gallops. Gastrointestinal system: Abdomen is nondistended, soft and nontender. No organomegaly or masses felt. Normal bowel sounds heard. Central nervous system: Alert and oriented. No focal neurological deficits. Extremities: No C/C/E, +pedal pulses Skin: No rashes, lesions or ulcers Psychiatry: Cooperative with examination, following commands and answering questions appropriately.  Mood & affect appropriate.     Data Reviewed: I have personally reviewed following labs and imaging studies  CBC: Recent Labs  Lab 06/01/19 2356 06/02/19 0854  WBC 13.2* 12.1*  NEUTROABS 10.1*  --   HGB 14.1 13.3  HCT 43.2 39.3  MCV 93.5 90.8  PLT 240 417   Basic Metabolic Panel: Recent Labs  Lab 06/01/19 2356 06/02/19 0854  NA 137 136  K 4.3 3.8  CL 102 101  CO2 23 24  GLUCOSE 166* 175*  BUN 14 13  CREATININE 0.95 0.96  CALCIUM 9.1  8.9  MG 1.9  --    GFR: Estimated Creatinine Clearance: 93 mL/min (by C-G formula based on SCr of 0.96 mg/dL).   Liver Function Tests: Recent Labs  Lab 06/01/19 2356 06/02/19 0854  AST 39 33  ALT 57* 49*  ALKPHOS 77 70  BILITOT 0.7 1.0  PROT 7.6 7.3  ALBUMIN 4.6 4.4   HbA1C: Recent Labs    06/02/19 0854  HGBA1C 6.5*   CBG: Recent Labs  Lab 06/01/19 2332  GLUCAP 137*   Urine analysis:    Component Value Date/Time   COLORURINE YELLOW 06/02/2019 0125   APPEARANCEUR CLEAR 06/02/2019 0125   APPEARANCEUR Clear 04/10/2014 2055   LABSPEC 1.019 06/02/2019 0125   LABSPEC 1.021 04/10/2014 2055   PHURINE 6.0 06/02/2019 0125   GLUCOSEU 50 (A) 06/02/2019 0125   GLUCOSEU 150 mg/dL 04/10/2014 2055   HGBUR NEGATIVE 06/02/2019 0125   BILIRUBINUR NEGATIVE 06/02/2019 0125   BILIRUBINUR Negative 04/10/2014 2055   KETONESUR NEGATIVE 06/02/2019 0125   PROTEINUR NEGATIVE 06/02/2019 0125   UROBILINOGEN 0.2 02/28/2014 2256   NITRITE NEGATIVE 06/02/2019 0125   LEUKOCYTESUR NEGATIVE 06/02/2019 0125   LEUKOCYTESUR Negative 04/10/2014 2055    Recent Results (from the past 240 hour(s))  SARS CORONAVIRUS 2 (TAT 6-24 HRS) Nasopharyngeal Nasopharyngeal Swab     Status: None   Collection Time: 06/02/19  1:52 AM   Specimen: Nasopharyngeal Swab  Result Value Ref Range Status   SARS Coronavirus 2 NEGATIVE NEGATIVE Final    Comment: (NOTE) SARS-CoV-2 target nucleic acids are NOT DETECTED. The SARS-CoV-2 RNA is generally detectable in upper and lower respiratory specimens during the acute phase of infection. Negative results do not preclude SARS-CoV-2 infection, do not rule out co-infections with other pathogens, and should not be used as the sole basis for treatment or other patient management decisions. Negative results must be combined with clinical observations, patient history, and epidemiological information. The expected result is Negative. Fact Sheet for  Patients: SugarRoll.be Fact Sheet for Healthcare Providers: https://www.woods-mathews.com/ This test is not yet approved or cleared by the [place] FDA and  has been authorized for detection and/or diagnosis of SARS-CoV-2 by FDA under an Emergency Use Authorization (EUA). This EUA will remain  in effect (meaning this test can be used) for the duration of the COVID-19 declaration under Section 56 4(b)(1) of the Act, 21 U.S.C. section 360bbb-3(b)(1), unless the authorization is terminated or revoked sooner. Performed at [facility], [place] [address] [place], [place] [address]   MRSA PCR Screening     Status: None   Collection Time: 06/02/19  1:23 PM   Specimen: Nasopharyngeal  Result Value Ref Range Status   MRSA by PCR NEGATIVE NEGATIVE Final    Comment:        The GeneXpert MRSA Assay (FDA approved for NASAL specimens only), is one component of a comprehensive MRSA colonization surveillance program. It is not intended to diagnose MRSA infection nor to guide or monitor treatment for MRSA infections. Performed at [facility], [address], [place], [place] [address]      Radiology Studies: CT Head Wo Contrast  Result Date: 06/02/2019 CLINICAL DATA:  Altered mental status. Possible seizure. Technologist  notes state constant head movement. EXAM: CT HEAD WITHOUT CONTRAST TECHNIQUE: Contiguous axial images were obtained from the base of the skull through the vertex without intravenous contrast. COMPARISON:  Head CT 05/21/2019 FINDINGS: Brain: Moderate motion artifact, despite repeat acquisition. Allowing for this, no evidence of acute infarct, hemorrhage, hydrocephalus, midline shift or mass effect. Vascular: No obvious hyperdense vessel. Skull: Negative allowing for motion. Sinuses/Orbits: No acute findings. Other: None. IMPRESSION: Motion limited exam without evidence of acute intracranial abnormality. Electronically Signed   By:  Narda Rutherford M.D.   On: 06/02/2019 01:03    Scheduled Meds: . Chlorhexidine Gluconate Cloth  6 each Topical Daily  . heparin  5,000 Units Subcutaneous Q8H  . pantoprazole (PROTONIX) IV  40 mg Intravenous Q24H   Continuous Infusions: . sodium chloride 125 mL/hr at 06/03/19 0624     LOS: 1 day    Time spent: 35 minutes. Greater than 50% of this time was spent in direct contact with the patient, coordinating care and discussing relevant ongoing clinical issues, including no bowel drug overdose, SIRS and concerns for uncontrolled underlying psychiatry disorder. Psych service was consulted, diet advance, patient denies currently SI or hallucinations.     Vassie Loll, MD [facility] Pager [phone]  06/03/2019, 1:23 PM

## 2019-06-03 NOTE — Progress Notes (Addendum)
Pt has slept all night other than shifting in the bed. Restraint order is in place but have not been applied as patient has not been inappropriate.    Someone saying they were his sister called for an update. Advised her that she was not on his list of contacts but all I could tell her was he was stable and slept all night.

## 2019-06-03 NOTE — [facility] Counselor (Signed)
Disposition: Reola Calkins, NP recommends in patient ([address]) once documented that he is medically clear. Patient has been cleared by poison control.  Per Lequita Halt, RN- patient currently on fluids but believes he should be ready tomorrow (1/23).
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
## 2019-06-03 NOTE — Progress Notes (Signed)
Patient to be transferred to MedSurg and in stable condition. Patient and wife made aware of transfer and agreeable. Patient report given to accepting RN. Patient transported to room 306 with sitter.  Quita Skye, RN

## 2019-06-04 MED ORDER — PHENOL 1.4 % MT LIQD
1.0000 | OROMUCOSAL | Status: DC | PRN
  Administered 2019-06-04 (×2): 1 via OROMUCOSAL
  Filled 2019-06-04: qty 177

## 2019-06-04 MED ORDER — MENTHOL 3 MG MT LOZG
1.0000 | LOZENGE | OROMUCOSAL | Status: DC | PRN
  Administered 2019-06-05: 07:00:00 3 mg via ORAL
  Filled 2019-06-04: qty 9
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
## 2019-06-04 NOTE — Progress Notes (Signed)
PROGRESS NOTE    Richard Welch  ZHY:[id number] DOB: 1971/04/23 DOA: 06/01/2019 PCP: Anola Gurney, PA     Brief Narrative:  As per H&P written by Dr. Robb Matar on 06/02/2019 49 y.o. male with medical history significant of bipolar disorder/depression and urolithiasis who is brought to the emergency department due to somnolence following an ingestion of an unknown amount of duloxetine and possibly a bottle of diphenhydramine after getting angry and upset at his wife because they have been separated.  He has been making threats to "take a bunch of pills".  He is unable to provide further history as he is sedated and somnolent.  ED Course: The patient had a urinary retention, which is likely due to the diphenhydramine.  A Foley catheter was placed.  Assessment & Plan: 1-bipolar disorder with prior history of suicidal attempt: Presented with medication overdose. -Drug of choice antihistamines (Benadryl) and Cymbalta. -After supportive care patient mentation has improved and is at this moment able to follow commands appropriately and has remained oriented x3. -Denies suicidal ideation or hallucination at this time. -Continue supportive care. -Stable to be discharged to psych facility when bed available.  2-Leukocytosis -No acute source of infection identified -Most likely stress demargination -Patient having SIRS on presentation, improved currently -WBCs trending down.  3-Hyperglycemia -A1c 6.5 -Lifestyle changes and modified carbohydrate diet recommended -Looking to proceed with PCP initiation of hypoglycemic medications.  4-GERD/GI prophylaxis -Continue PPI  5-sore throat -No erythema changes appreciated -Continue as needed Cepacol   DVT prophylaxis: Heparin Code Status: Full code Family Communication: No family at bedside. Disposition Plan: Medically stable to be discharge to psychiatric facility.  Remains inpatient.  Follow psychiatry consultation and recommendations.   Continue sitter  Consultants:   Psychiatry service.  Procedures:   See below for x-ray reports.  Antimicrobials:  Anti-infectives (From admission, onward)   None      Subjective: Patient has remained oriented x3, no agitation, no chest pain, no shortness of breath, no nausea, no vomiting.  No suicidal ideation or hallucination at this time.  Complaining of mild sore throat.  Objective: Vitals:   06/03/19 1653 06/03/19 2104 06/04/19 0512 06/04/19 1322  BP: 121/81 121/76 124/81 138/73  Pulse: 91 82 77 80  Resp: 20 18 18 16   Temp:  98.8 F (37.1 C) 98.1 F (36.7 C) 98.5 F (36.9 C)  TempSrc:  Oral Oral Oral  SpO2: 100% 97% 99% 100%  Weight:      Height:        Intake/Output Summary (Last 24 hours) at 06/04/2019 1537 Last data filed at 06/04/2019 1300 Gross per 24 hour  Intake 2077.82 ml  Output 1250 ml  Net 827.82 ml   Filed Weights   06/01/19 2313 06/02/19 1325  Weight: 77 kg 77.6 kg    Examination: General exam: Alert, awake, oriented x 3; complaining of mild sore throat.  No shortness of breath, no chest pain, no nausea, no vomiting.  Denies suicidal ideation or hallucinations at this time. Respiratory system: Clear to auscultation. Respiratory effort normal. Cardiovascular system:RRR. No murmurs, rubs, gallops. Gastrointestinal system: Abdomen is nondistended, soft and nontender. No organomegaly or masses felt. Normal bowel sounds heard. Central nervous system: Alert and oriented. No focal neurological deficits. Extremities: No C/C/E, +pedal pulses Skin: No rashes, lesions or ulcers Psychiatry: Mood & affect appropriate.  Cooperative with examination.  Following commands appropriately.   Data Reviewed: I have personally reviewed following labs and imaging studies  CBC: Recent Labs  Lab 06/01/19 2356  06/02/19 0854  WBC 13.2* 12.1*  NEUTROABS 10.1*  --   HGB 14.1 13.3  HCT 43.2 39.3  MCV 93.5 90.8  PLT 240 865   Basic Metabolic Panel: Recent Labs    Lab 06/01/19 2356 06/02/19 0854  NA 137 136  K 4.3 3.8  CL 102 101  CO2 23 24  GLUCOSE 166* 175*  BUN 14 13  CREATININE 0.95 0.96  CALCIUM 9.1 8.9  MG 1.9  --    GFR: Estimated Creatinine Clearance: 93 mL/min (by C-G formula based on SCr of 0.96 mg/dL).   Liver Function Tests: Recent Labs  Lab 06/01/19 2356 06/02/19 0854  AST 39 33  ALT 57* 49*  ALKPHOS 77 70  BILITOT 0.7 1.0  PROT 7.6 7.3  ALBUMIN 4.6 4.4   HbA1C: Recent Labs    06/02/19 0854  HGBA1C 6.5*   CBG: Recent Labs  Lab 06/01/19 2332  GLUCAP 137*   Urine analysis:    Component Value Date/Time   COLORURINE YELLOW 06/02/2019 0125   APPEARANCEUR CLEAR 06/02/2019 0125   APPEARANCEUR Clear 04/10/2014 2055   LABSPEC 1.019 06/02/2019 0125   LABSPEC 1.021 04/10/2014 2055   PHURINE 6.0 06/02/2019 0125   GLUCOSEU 50 (A) 06/02/2019 0125   GLUCOSEU 150 mg/dL 04/10/2014 2055   HGBUR NEGATIVE 06/02/2019 0125   BILIRUBINUR NEGATIVE 06/02/2019 0125   BILIRUBINUR Negative 04/10/2014 2055   KETONESUR NEGATIVE 06/02/2019 0125   PROTEINUR NEGATIVE 06/02/2019 0125   UROBILINOGEN 0.2 02/28/2014 2256   NITRITE NEGATIVE 06/02/2019 0125   LEUKOCYTESUR NEGATIVE 06/02/2019 0125   LEUKOCYTESUR Negative 04/10/2014 2055    Recent Results (from the past 240 hour(s))  SARS CORONAVIRUS 2 (TAT 6-24 HRS) Nasopharyngeal Nasopharyngeal Swab     Status: None   Collection Time: 06/02/19  1:52 AM   Specimen: Nasopharyngeal Swab  Result Value Ref Range Status   SARS Coronavirus 2 NEGATIVE NEGATIVE Final    Comment: (NOTE) SARS-CoV-2 target nucleic acids are NOT DETECTED. The SARS-CoV-2 RNA is generally detectable in upper and lower respiratory specimens during the acute phase of infection. Negative results do not preclude SARS-CoV-2 infection, do not rule out co-infections with other pathogens, and should not be used as the sole basis for treatment or other patient management decisions. Negative results must be combined  with clinical observations, patient history, and epidemiological information. The expected result is Negative. Fact Sheet for Patients: SugarRoll.be Fact Sheet for Healthcare Providers: https://www.woods-mathews.com/ This test is not yet approved or cleared by the [place] FDA and  has been authorized for detection and/or diagnosis of SARS-CoV-2 by FDA under an Emergency Use Authorization (EUA). This EUA will remain  in effect (meaning this test can be used) for the duration of the COVID-19 declaration under Section 56 4(b)(1) of the Act, 21 U.S.C. section 360bbb-3(b)(1), unless the authorization is terminated or revoked sooner. Performed at [facility], [place] [address], [place], [place] [address]   MRSA PCR Screening     Status: None   Collection Time: 06/02/19  1:23 PM   Specimen: Nasopharyngeal  Result Value Ref Range Status   MRSA by PCR NEGATIVE NEGATIVE Final    Comment:        The GeneXpert MRSA Assay (FDA approved for NASAL specimens only), is one component of a comprehensive MRSA colonization surveillance program. It is not intended to diagnose MRSA infection nor to guide or monitor treatment for MRSA infections. Performed at [facility], [address] [place], [place] [address]  Radiology Studies: No results found.  Scheduled Meds: . Chlorhexidine Gluconate Cloth  6 each Topical Daily  . heparin  5,000 Units Subcutaneous Q8H  . pantoprazole  40 mg Oral Daily   Continuous Infusions: . sodium chloride 125 mL/hr at 06/04/19 1339     LOS: 2 days    Time spent: 30 minutes.   Vassie Loll, MD [facility] Pager [phone]  06/04/2019, 3:37 PM

## 2019-06-04 NOTE — BH Assessment (Signed)
[facility] Assessment Progress Note     Completed reassessment for [name]. Per ED notes, "He presented voluntarily to [facility] ED following an ingestion of an unknown amount of Cymbalta and Benadryl after an argument with his estranged wife". Patient admitted to ICU. Patient during today's assessment denies intentional overdose. He reports no recollection of taking medications and/or attempts to harm himself. States that he is prescribed medications by his provider but is often non compliant because he is forgetful. He reports that over the last several weeks their has been #3 occasions where he has presented to the Emergency Room with complaints of "black outs". States that he has been accused of being intoxicated, using drugs, and/or under the influence. He also reports that he has been told that when he has these episodes he becomes aggressive with spouse and child. He reports no memory of these events. Patient was oriented to time, person, place, etc. He reports #1 hospitalization years ago for an intentional overdose. States that he was having marital issues during that time and this is the reason for the suicide attempt.   He is currently calm and cooperative. Oriented to time, person, place. Denies current SI, HI, and AVH's. Denies history of alcohol and drug use. UDS positive for Benzo's. He reports no recollection of taking Benzo's.

## 2019-06-05 ENCOUNTER — Encounter (HOSPITAL_COMMUNITY): Payer: Self-pay | Admitting: Psychiatry

## 2019-06-05 ENCOUNTER — Inpatient Hospital Stay (HOSPITAL_COMMUNITY)
Admission: AD | Admit: 2019-06-05 | Discharge: 2019-06-10 | DRG: 885 | Disposition: A | Discharge status: Home / Self Care | Payer: BC Managed Care – PPO | Attending: Psychiatry | Admitting: Psychiatry

## 2019-06-05 ENCOUNTER — Other Ambulatory Visit: Payer: Self-pay

## 2019-06-05 DIAGNOSIS — K219 Gastro-esophageal reflux disease without esophagitis: Secondary | ICD-10-CM

## 2019-06-05 DIAGNOSIS — Z880 Allergy status to penicillin: Secondary | ICD-10-CM | POA: Diagnosis not present

## 2019-06-05 DIAGNOSIS — E1165 Type 2 diabetes mellitus with hyperglycemia: Secondary | ICD-10-CM | POA: Diagnosis present

## 2019-06-05 DIAGNOSIS — E119 Type 2 diabetes mellitus without complications: Secondary | ICD-10-CM | POA: Diagnosis not present

## 2019-06-05 DIAGNOSIS — D72829 Elevated white blood cell count, unspecified: Secondary | ICD-10-CM | POA: Diagnosis not present

## 2019-06-05 DIAGNOSIS — T450X4A Poisoning by antiallergic and antiemetic drugs, undetermined, initial encounter: Secondary | ICD-10-CM | POA: Diagnosis not present

## 2019-06-05 DIAGNOSIS — F4329 Adjustment disorder with other symptoms: Secondary | ICD-10-CM

## 2019-06-05 DIAGNOSIS — T43221A Poisoning by selective serotonin reuptake inhibitors, accidental (unintentional), initial encounter: Secondary | ICD-10-CM

## 2019-06-05 DIAGNOSIS — Z882 Allergy status to sulfonamides status: Secondary | ICD-10-CM | POA: Diagnosis not present

## 2019-06-05 DIAGNOSIS — Z915 Personal history of self-harm: Secondary | ICD-10-CM

## 2019-06-05 DIAGNOSIS — Z79899 Other long term (current) drug therapy: Secondary | ICD-10-CM | POA: Diagnosis not present

## 2019-06-05 DIAGNOSIS — D649 Anemia, unspecified: Secondary | ICD-10-CM | POA: Diagnosis present

## 2019-06-05 DIAGNOSIS — F332 Major depressive disorder, recurrent severe without psychotic features: Secondary | ICD-10-CM | POA: Diagnosis present

## 2019-06-05 DIAGNOSIS — T43224A Poisoning by selective serotonin reuptake inhibitors, undetermined, initial encounter: Secondary | ICD-10-CM | POA: Diagnosis not present

## 2019-06-05 LAB — BASIC METABOLIC PANEL
Anion gap: 8 (ref 5–15)
BUN: 9 mg/dL (ref 6–20)
CO2: 25 mmol/L (ref 22–32)
Calcium: 8.4 mg/dL — ABNORMAL LOW (ref 8.9–10.3)
Chloride: 108 mmol/L (ref 98–111)
Creatinine, Ser: 0.92 mg/dL (ref 0.61–1.24)
GFR calc Af Amer: >60 mL/min (ref >60)
GFR calc non Af Amer: >60 mL/min (ref >60)
Glucose, Bld: 131 mg/dL — ABNORMAL HIGH (ref 70–99)
Potassium: 3.8 mmol/L (ref 3.5–5.1)
Sodium: 141 mmol/L (ref 135–145)

## 2019-06-05 LAB — CBC
HCT: 34 % — ABNORMAL LOW (ref 39.0–52.0)
Hemoglobin: 11.3 g/dL — ABNORMAL LOW (ref 13.0–17.0)
MCH: 30.5 pg (ref 26.0–34.0)
MCHC: 33.2 g/dL (ref 30.0–36.0)
MCV: 91.6 fL (ref 80.0–100.0)
Platelets: 203 10*3/uL (ref 150–400)
RBC: 3.71 MIL/uL — ABNORMAL LOW (ref 4.22–5.81)
RDW: 11.7 % (ref 11.5–15.5)
WBC: 5.5 10*3/uL (ref 4.0–10.5)
nRBC: 0 % (ref 0.0–0.2)

## 2019-06-05 MED ORDER — MENTHOL 3 MG MT LOZG: 1.0000 | LOZENGE | OROMUCOSAL | 12 refills | Status: DC | PRN

## 2019-06-05 MED ORDER — ALUM & MAG HYDROXIDE-SIMETH 200-200-20 MG/5ML PO SUSP: 30.0000 mL | ORAL | Status: DC | PRN

## 2019-06-05 MED ORDER — ACETAMINOPHEN 325 MG PO TABS
650.0000 mg | ORAL_TABLET | Freq: Four times a day (QID) | ORAL | Status: DC | PRN
  Administered 2019-06-08: 17:00:00 650 mg via ORAL
  Filled 2019-06-05: qty 2

## 2019-06-05 MED ORDER — TRAZODONE HCL 50 MG PO TABS
50.0000 mg | ORAL_TABLET | Freq: Every evening | ORAL | Status: DC | PRN
  Administered 2019-06-06 – 2019-06-08 (×3): 50 mg via ORAL
  Filled 2019-06-05 (×3): qty 1

## 2019-06-05 MED ORDER — PANTOPRAZOLE SODIUM 40 MG PO TBEC: 40.0000 mg | DELAYED_RELEASE_TABLET | Freq: Every day | ORAL | Status: AC

## 2019-06-05 MED ORDER — MAGNESIUM HYDROXIDE 400 MG/5ML PO SUSP: 30.0000 mL | Freq: Every day | ORAL | Status: DC | PRN

## 2019-06-05 MED ORDER — DULOXETINE HCL 60 MG PO CPEP: 60.0000 mg | ORAL_CAPSULE | Freq: Every day | ORAL | Status: DC

## 2019-06-05 MED ORDER — HYDROXYZINE HCL 25 MG PO TABS
25.0000 mg | ORAL_TABLET | Freq: Three times a day (TID) | ORAL | Status: DC | PRN
  Administered 2019-06-06 – 2019-06-07 (×2): 25 mg via ORAL
  Filled 2019-06-05 (×2): qty 1

## 2019-06-05 NOTE — Discharge Summary (Signed)
Physician Discharge Summary  Richard Welch NGE:[id number] DOB: [birth year] DOA: 06/01/2019  PCP: Carmon Ginsberg, PA  Admit date: 06/01/2019 Discharge date: 06/05/2019  Time spent: 35 minutes  Recommendations for Outpatient Follow-up:  1. Repeat basic metabolic panel to follow lites and renal function 2. Close monitoring to patient CBGs and A1c with initiation of hypoglycemic regimen as recommended, if his lifestyle changes fail to control blood sugar.   Discharge Diagnoses:  Principal Problem:   Antihistamines overdose Active Problems:   Antidepressant overdose   Leukocytosis   Hyperglycemia   Type 2 diabetes mellitus without complication, without long-term current use of insulin (HCC)   Diphenhydramine overdose of undetermined intent   Fluoxetine hydrochloride poisoning   Gastroesophageal reflux disease   Discharge Condition: Hemodynamically stable and ready to be transferred to psychiatry unit.  Will recommend follow-up with PCP in 2 weeks after discharge from psychiatry facility.  Diet recommendation: Modified carbohydrate diet.  Filed Weights   06/01/19 2313 06/02/19 1325  Weight: 77 kg 77.6 kg    History of present illness:  As per H&P written by Dr. Olevia Bowens on 06/02/2019 49 y.o.malewith medical history significant ofbipolar disorder/depression and urolithiasis who is brought to the emergency department due to somnolence following an ingestion of an unknown amount of duloxetine and possibly a bottle of diphenhydramine after getting angry and upset at his wife because they have been separated. He has been making threats to "take a bunch of pills". He is unable to provide further history as he is sedated and somnolent.  ED Course:The patient had a urinary retention, which is likely due to the diphenhydramine. A Foley catheter was placed.  Hospital Course:  1-bipolar disorder with prior history of suicidal attempt: Presented with medication overdose. -Drug of choice  antihistamines (Benadryl) and Cymbalta. -After supportive care patient mentation has improved and has remained stable. Patient is oriented X 3 and cooperative with examination. -Antidepressant will continue to be on hold at time of transfer; psychiatry service to restart, adjust and change treatment for his mood disorders as required. -Denies suicidal ideation or hallucination at this time. -Continue supportive care. -Stable to be discharged to psych facility.  2-Leukocytosis -No acute source of infection identified -Most likely stress demargination -Patient having SIRS on presentation, improved currently -WBCs trended down and WNL at discharge.  3-Hyperglycemia/type 2 diabetes -A1c 6.5 -Lifestyle changes and modified carbohydrate diet recommended. -Patient looking to follow up with PCP prior to initiation of hypoglycemic medications. -will recommend starting metformin 500 mg BID.  4-GERD/GI prophylaxis -Continue PPI  5-sore throat -No erythematous changes appreciated -Continue as needed Cepacol  Procedures:  See below for x-ray reports.  Consultations:  Psychiatry service been  Discharge Exam: Vitals:   06/04/19 2101 06/05/19 0526  BP: 126/80 129/82  Pulse: 73 64  Resp: 17 16  Temp: 98.1 F (36.7 C) 97.8 F (36.6 C)  SpO2: 98% 99%   General exam: Alert, awake, oriented x 3; complaining of mild sore throat.  No shortness of breath, no chest pain, no nausea, no vomiting.  Denies suicidal ideation or hallucinations at this time. Respiratory system: Clear to auscultation. Respiratory effort normal. Cardiovascular system:RRR. No murmurs, rubs, gallops. Gastrointestinal system: Abdomen is nondistended, soft and nontender. No organomegaly or masses felt. Normal bowel sounds heard. Central nervous system: Alert and oriented. No focal neurological deficits. Extremities: No C/C/E, +pedal pulses Skin: No rashes, lesions or ulcers Psychiatry: Mood & affect appropriate.   Cooperative with examination.  Following commands appropriately.   Discharge Instructions  Discharge Instructions    Diet Carb Modified   Complete by: As directed    Discharge instructions   Complete by: As directed    Maintain adequate hydration Take medications as prescribed Arrange follow up with PCP in 2 weeks after discharge from Psychiatry facility. Follow modified carbohydrates diet.   Increase activity slowly   Complete by: As directed      Allergies as of 06/05/2019      Reactions   Penicillins Rash   Has patient had a PCN reaction causing immediate rash, facial/tongue/throat swelling, SOB or lightheadedness with hypotension: Yes Has patient had a PCN reaction causing severe rash involving mucus membranes or skin necrosis: Yes Has patient had a PCN reaction that required hospitalization: No Has patient had a PCN reaction occurring within the last 10 years: No If all of the above answers are "NO", then may proceed with Cephalosporin use.   Sulfa Antibiotics Rash      Medication List    STOP taking these medications   cyclobenzaprine 5 MG tablet Commonly known as: FLEXERIL   naproxen 500 MG tablet Commonly known as: NAPROSYN     TAKE these medications   acetaminophen 325 MG tablet Commonly known as: TYLENOL Take 650 mg by mouth every 6 (six) hours as needed for mild pain.   DULoxetine 60 MG capsule Commonly known as: CYMBALTA Take 1 capsule (60 mg total) by mouth daily. Hold until further instruction by psychiatry service. What changed: additional instructions   menthol-cetylpyridinium 3 MG lozenge Commonly known as: CEPACOL Take 1 lozenge (3 mg total) by mouth as needed for sore throat.   naproxen sodium 220 MG tablet Commonly known as: ALEVE Take 220 mg by mouth daily as needed (for pain).   pantoprazole 40 MG tablet Commonly known as: PROTONIX Take 1 tablet (40 mg total) by mouth daily.      Allergies  Allergen Reactions  . Penicillins Rash     Has patient had a PCN reaction causing immediate rash, facial/tongue/throat swelling, SOB or lightheadedness with hypotension: Yes Has patient had a PCN reaction causing severe rash involving mucus membranes or skin necrosis: Yes Has patient had a PCN reaction that required hospitalization: No Has patient had a PCN reaction occurring within the last 10 years: No If all of the above answers are "NO", then may proceed with Cephalosporin use.   Gaetana Michaelis Antibiotics Rash   Follow-up Information    Anola Gurney, [place]. Schedule an appointment as soon as possible for a visit in 2 week(s).   Specialty: Family Medicine Why: after discharge from Psych facility. Contact information: [address] [place] [address] [phone]            The results of significant diagnostics from this hospitalization (including imaging, microbiology, ancillary and laboratory) are listed below for reference.    Significant Diagnostic Studies: CT Head Wo Contrast  Result Date: 06/02/2019 CLINICAL DATA:  Altered mental status. Possible seizure. Technologist notes state constant head movement. EXAM: CT HEAD WITHOUT CONTRAST TECHNIQUE: Contiguous axial images were obtained from the base of the skull through the vertex without intravenous contrast. COMPARISON:  Head CT 05/21/2019 FINDINGS: Brain: Moderate motion artifact, despite repeat acquisition. Allowing for this, no evidence of acute infarct, hemorrhage, hydrocephalus, midline shift or mass effect. Vascular: No obvious hyperdense vessel. Skull: Negative allowing for motion. Sinuses/Orbits: No acute findings. Other: None. IMPRESSION: Motion limited exam without evidence of acute intracranial abnormality. Electronically Signed   By: Narda Rutherford M.D.   On:  06/02/2019 01:03   CT HEAD WO CONTRAST  Result Date: 05/21/2019 CLINICAL DATA:  Blurry vision and gait abnormality. EXAM: CT HEAD WITHOUT CONTRAST TECHNIQUE: Contiguous axial images were obtained  from the base of the skull through the vertex without intravenous contrast. COMPARISON:  None. FINDINGS: Brain: There is no mass, hemorrhage or extra-axial collection. The size and configuration of the ventricles and extra-axial CSF spaces are normal. The brain parenchyma is normal, without acute or chronic infarction. Vascular: No abnormal hyperdensity of the major intracranial arteries or dural venous sinuses. No intracranial atherosclerosis. Skull: The visualized skull base, calvarium and extracranial soft tissues are normal. Sinuses/Orbits: No fluid levels or advanced mucosal thickening of the visualized paranasal sinuses. No mastoid or middle ear effusion. The orbits are normal. IMPRESSION: Normal head CT. Electronically Signed   By: Deatra Robinson M.D.   On: 05/21/2019 04:23    Microbiology: Recent Results (from the past 240 hour(s))  SARS CORONAVIRUS 2 (TAT 6-24 HRS) Nasopharyngeal Nasopharyngeal Swab     Status: None   Collection Time: 06/02/19  1:52 AM   Specimen: Nasopharyngeal Swab  Result Value Ref Range Status   SARS Coronavirus 2 NEGATIVE NEGATIVE Final    Comment: (NOTE) SARS-CoV-2 target nucleic acids are NOT DETECTED. The SARS-CoV-2 RNA is generally detectable in upper and lower respiratory specimens during the acute phase of infection. Negative results do not preclude SARS-CoV-2 infection, do not rule out co-infections with other pathogens, and should not be used as the sole basis for treatment or other patient management decisions. Negative results must be combined with clinical observations, patient history, and epidemiological information. The expected result is Negative. Fact Sheet for Patients: HairSlick.no Fact Sheet for Healthcare Providers: quierodirigir.com This test is not yet approved or cleared by the [place] FDA and  has been authorized for detection and/or diagnosis of SARS-CoV-2 by FDA under an  Emergency Use Authorization (EUA). This EUA will remain  in effect (meaning this test can be used) for the duration of the COVID-19 declaration under Section 56 4(b)(1) of the Act, 21 U.S.C. section 360bbb-3(b)(1), unless the authorization is terminated or revoked sooner. Performed at [facility], [address] [place], [place] [address]   MRSA PCR Screening     Status: None   Collection Time: 06/02/19  1:23 PM   Specimen: Nasopharyngeal  Result Value Ref Range Status   MRSA by PCR NEGATIVE NEGATIVE Final    Comment:        The GeneXpert MRSA Assay (FDA approved for NASAL specimens only), is one component of a comprehensive MRSA colonization surveillance program. It is not intended to diagnose MRSA infection nor to guide or monitor treatment for MRSA infections. Performed at [facility], [address] [place], [place] [address]      Labs: Basic Metabolic Panel: Recent Labs  Lab 06/01/19 2356 06/02/19 0854  NA 137 136  K 4.3 3.8  CL 102 101  CO2 23 24  GLUCOSE 166* 175*  BUN 14 13  CREATININE 0.95 0.96  CALCIUM 9.1 8.9  MG 1.9  --    Liver Function Tests: Recent Labs  Lab 06/01/19 2356 06/02/19 0854  AST 39 33  ALT 57* 49*  ALKPHOS 77 70  BILITOT 0.7 1.0  PROT 7.6 7.3  ALBUMIN 4.6 4.4   CBC: Recent Labs  Lab 06/01/19 2356 06/02/19 0854 06/05/19 0610  WBC 13.2* 12.1* 5.5  NEUTROABS 10.1*  --   --   HGB 14.1 13.3 11.3*  HCT  43.2 39.3 34.0*  MCV 93.5 90.8 91.6  PLT 240 230 203    CBG: Recent Labs  Lab 06/01/19 2332  GLUCAP 137*    Signed:  Vassie Loll MD.  [facility] 06/05/2019, 7:26 AM

## 2019-06-05 NOTE — Progress Notes (Signed)
Patient is a 49 year old male admitted from [facility] ED for reportedly ingesting an unknown amount of Cymbalta and Benadryl. Pt currently denies this being a suicide attempt, reporting that he is not suicidal. Pt presents as very anxious, but friendly and talkative throughout admission interview. Pt denies pain, SI/HI and A/V H. Pt denies using drugs and alcohol. Admission paperwork completed and signed-verbal understanding expressed. VS obtained. Skin assessment revealed no abnormalities (1 inch laceration left forearm that pt reports is from IV) . Pt had no belongings to search. Pt oriented to unit. Q 15 min checks initiated for safety.

## 2019-06-05 NOTE — Tx Team (Signed)
"  Initial Treatment Plan 06/05/2019 7:43 PM [name] VAE:[id number]    PATIENT STRESSORS: Marital or family conflict Traumatic event   PATIENT STRENGTHS: Motivation for treatment/growth Supportive family/friends   PATIENT IDENTIFIED PROBLEMS:      "anger issues"    " daughter in MVA"      "depression"         DISCHARGE CRITERIA:  Improved stabilization in mood, thinking, and/or behavior Reduction of life-threatening or endangering symptoms to within safe limits Verbal commitment to aftercare and medication compliance  PRELIMINARY DISCHARGE PLAN: Outpatient therapy Return to previous living arrangement  PATIENT/FAMILY INVOLVEMENT: This treatment plan has been presented to and reviewed with the patient, Richard Welch,  The patient has been given the opportunity to ask questions and make suggestions.  Shela Nevin, RN 06/05/2019, 7:43 PM

## 2019-06-05 NOTE — Progress Notes (Signed)
Report given to Behavioral Health this am, police officers here to transport patient, paperwork given to officers. Patient stable at this time

## 2019-06-05 NOTE — Progress Notes (Signed)
Pt accepted to inpatient room 301-01 [facility], located at [address] [place], [place].  Pt to be transported by [facility] Department.  Number to facility - [phone]

## 2019-06-06 DIAGNOSIS — F332 Major depressive disorder, recurrent severe without psychotic features: Principal | ICD-10-CM

## 2019-06-06 LAB — CBC
HCT: 38.3 % — ABNORMAL LOW (ref 39.0–52.0)
Hemoglobin: 12.9 g/dL — ABNORMAL LOW (ref 13.0–17.0)
MCH: 30.7 pg (ref 26.0–34.0)
MCHC: 33.7 g/dL (ref 30.0–36.0)
MCV: 91.2 fL (ref 80.0–100.0)
Platelets: 267 10*3/uL (ref 150–400)
RBC: 4.2 MIL/uL — ABNORMAL LOW (ref 4.22–5.81)
RDW: 11.8 % (ref 11.5–15.5)
WBC: 7.5 10*3/uL (ref 4.0–10.5)
nRBC: 0 % (ref 0.0–0.2)

## 2019-06-06 LAB — TSH: TSH: 3.22 u[IU]/mL (ref 0.350–4.500)

## 2019-06-06 MED ORDER — SERTRALINE HCL 50 MG PO TABS
50.0000 mg | ORAL_TABLET | Freq: Every day | ORAL | Status: DC
  Administered 2019-06-06 – 2019-06-09 (×4): 50 mg via ORAL
  Filled 2019-06-06 (×9): qty 1

## 2019-06-06 MED ORDER — METFORMIN HCL 500 MG PO TABS
500.0000 mg | ORAL_TABLET | Freq: Two times a day (BID) | ORAL | Status: DC
  Administered 2019-06-06 – 2019-06-09 (×6): 500 mg via ORAL
  Filled 2019-06-06 (×14): qty 1

## 2019-06-06 NOTE — Progress Notes (Signed)
Patient's wife Jamaal Bernasconi [phone]) called and reported patient has been threatening to kill her and their daughter. She is requesting to be notified when patient is discharged.
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
## 2019-06-06 NOTE — B[facility] Group Notes (Signed)
LCSW Group Therapy Note 06/06/2019 1:58 PM  Type of Therapy and Topic: Group Therapy: Overcoming Obstacles  Participation Level: Active  Description of Group:  In this group patients will be encouraged to explore what they see as obstacles to their own wellness and recovery. They will be guided to discuss their thoughts, feelings, and behaviors related to these obstacles. The group will process together ways to cope with barriers, with attention given to specific choices patients can make. Each patient will be challenged to identify changes they are motivated to make in order to overcome their obstacles. This group will be process-oriented, with patients participating in exploration of their own experiences as well as giving and receiving support and challenge from other group members.  Therapeutic Goals: 1. Patient will identify personal and current obstacles as they relate to admission. 2. Patient will identify barriers that currently interfere with their wellness or overcoming obstacles.  3. Patient will identify feelings, thought process and behaviors related to these barriers. 4. Patient will identify two changes they are willing to make to overcome these obstacles:   Summary of Patient Progress  [place] was engaged and participated throughout the group session. Isaiah reports he does not have any current obstacles or issues at this time.    Therapeutic Modalities:  Cognitive Behavioral Therapy Solution Focused Therapy Motivational Interviewing Relapse Prevention Therapy   Alcario Drought Clinical Social Worker

## 2019-06-06 NOTE — Progress Notes (Signed)
Recreation Therapy Notes  Date:  1.25.21 Time: 0930 Location: [address] Group Room  Group Topic: Stress Management  Goal Area(s) Addresses:  Patient will identify positive stress management techniques. Patient will identify benefits of using stress management post d/c.  Intervention: Stress Management  Activity :  Meditation.  LRT played a meditation that focused on the making most of your day and exploring the possibilities each moment holds.  Patients were to listen and follow along as meditation played to engage in group.   Education:  Stress Management, Discharge Planning.   Education Outcome: Acknowledges Education  Clinical Observations/Feedback: Pt did not attend group activity.    Caroll Rancher, LRT/CTRS         Caroll Rancher A 06/06/2019 10:54 AM

## 2019-06-06 NOTE — Progress Notes (Signed)
Patient observed in bed resting. Respirations even and non labored. No distress noted. Monitoring continues.
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
## 2019-06-07 DIAGNOSIS — F4329 Adjustment disorder with other symptoms: Secondary | ICD-10-CM

## 2019-06-07 NOTE — Progress Notes (Signed)
Recreation Therapy Notes  Animal-Assisted Activity (AAA) Program Checklist/Progress Notes Patient Eligibility Criteria Checklist & Daily Group note for Rec Tx Intervention  Date: 1.26.21 Time: 1430 Location: 300 Hall Dayroom   AAA/T Program Assumption of Risk Form signed by Patient/ or Parent Legal Guardian  YES   Patient is free of allergies or sever asthma  YES   Patient reports no fear of animals  YES   Patient reports no history of cruelty to animals  YES   Patient understands his/her participation is voluntary  YES  Patient washes hands before animal contact  YES   Patient washes hands after animal contact  YES   Behavioral Response: Engaged  Education: Hand Washing, Appropriate Animal Interaction   Education Outcome: Acknowledges understanding/In group clarification offered/Needs additional education.   Clinical Observations/Feedback: Pt attended and participated in activity.     Darcelle Herrada, LRT/CTRS         Avangelina Flight A 06/07/2019 3:23 PM 

## 2019-06-07 NOTE — Progress Notes (Signed)
Patient denies SI, HI and AVH.  Patient reports wanting the psychiatrist to talk with his family.  Patient states that his medications was causing him to black out and go into periods of rage.   Assess patient for safety, offer medications as prescribed, engage patient in 1:1 staff talks.   Continue to monitor as planned. Patient able to contract for safety.
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
## 2019-06-07 NOTE — Progress Notes (Signed)
   06/07/19 2124  Psych Admission Type (Psych Patients Only)  Admission Status Voluntary  Psychosocial Assessment  Patient Complaints None  Eye Contact Fair  Facial Expression Sad  Affect Depressed  Speech Logical/coherent  Interaction Assertive  Motor Activity Slow  Appearance/Hygiene Unremarkable  Behavior Characteristics Cooperative  Mood Anxious  Aggressive Behavior  Effect No apparent injury  Thought Process  Coherency WDL  Content WDL  Delusions None reported or observed  Perception WDL  Hallucination None reported or observed  Judgment Poor  Confusion None  Danger to Self  Current suicidal ideation? Denies  Agreement Not to Harm Self Yes  Description of Agreement verbal  Danger to Others  Danger to Others None reported or observed   Pt denies SI, HI, AVH and pain. Pt contracts for safety.

## 2019-06-07 NOTE — Progress Notes (Signed)
Adult Psychoeducational Group Note  Date:  06/07/2019 Time:  9:16 PM  Group Topic/Focus:  Wrap-Up Group:   The focus of this group is to help patients review their daily goal of treatment and discuss progress on daily workbooks.  Participation Level:  Active  Participation Quality:  Appropriate  Affect:  Appropriate  Cognitive:  Appropriate  Insight: Appropriate  Engagement in Group:  Engaged  Modes of Intervention:  Discussion  Additional Comments:  Patient attended group and said that his day was a 10 out of 10. His goal for to day was to feel better and go home.   Richard Welch 06/07/2019, 9:16 PM
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
## 2019-06-08 NOTE — B[facility] Counselor (Signed)
LCSW Group Therapy Note  06/08/2019 1:59 PM  Type of Therapy/Topic: Group Therapy: Feelings about Diagnosis  Participation Level: Minimal   Description of Group:  This group will allow patients to explore their thoughts and feelings about diagnoses they have received. Patients will be guided to explore their level of understanding and acceptance of these diagnoses. Facilitator will encourage patients to process their thoughts and feelings about the reactions of others to their diagnosis and will guide patients in identifying ways to discuss their diagnosis with significant others in their lives. This group will be process-oriented, with patients participating in exploration of their own experiences, giving and receiving support, and processing challenge from other group members.  Therapeutic Goals: 1. Patient will demonstrate understanding of diagnosis as evidenced by identifying two or more symptoms of the disorder 2. Patient will be able to express two feelings regarding the diagnosis 3. Patient will demonstrate their ability to communicate their needs through discussion and/or role play  Summary of Patient Progress: Omarion declined to participate in group but listened attentively to his peers. He shared only that he does not recall the events prior to admission.  Therapeutic Modalities:  Cognitive Behavioral Therapy Brief Therapy Feelings Identification   Enid Cutter, MSW, [facility] Clinical Social Worker

## 2019-06-08 NOTE — Plan of Care (Signed)
Progress note  D: pt found in bed; compliant with medication administration. Pt states they slept well. Pt rates their depression/hopelessness/anxiety a 0/0/0 out of 10 respectively. Pt denies any physical problems or pain. Pt states their goal for today is to get better and go home. Pt will achieve this by taking their medications. Pt is minimal and seems to be withholding parts of their story at times. Pt is pleasant. Pt denies si/hi/ah/vh and verbally agrees to approach staff if these become apparent or before harming themself/others while at bhh.  A: Pt provided support and encouragement. Pt given medication per protocol and standing orders. Q1m safety checks implemented and continued.  R: Pt safe on the unit. Will continue to monitor.  Pt progressing in the following metrics  Problem: Education: Goal: Knowledge of [facility] Education information/materials will improve Outcome: Progressing Goal: Emotional status will improve Outcome: Progressing Goal: Mental status will improve Outcome: Progressing Goal: Verbalization of understanding the information provided will improve Outcome: Progressing

## 2019-06-08 NOTE — Progress Notes (Signed)
Adult Psychoeducational Group Note  Date:  06/08/2019 Time:  8:48 PM  Group Topic/Focus:  Wrap-Up Group:   The focus of this group is to help patients review their daily goal of treatment and discuss progress on daily workbooks.  Participation Level:  Active  Participation Quality:  Appropriate  Affect:  Appropriate  Cognitive:  Appropriate  Insight: Appropriate  Engagement in Group:  Engaged  Modes of Intervention:  Discussion  Additional Comments:  Patient attended group and participated.   Florie Carico W Yaritza Leist 06/08/2019, 8:48 PM

## 2019-06-08 NOTE — [facility] Suicide Risk Assessment (Signed)
[facility] INPATIENT:  Family/Significant Other Suicide Prevention Education  Suicide Prevention Education:  Education Completed; with wife, Marquail Bradwell [phone]) has been identified by the patient as the family member/significant other with whom the patient will be residing, and identified as the person(s) who will aid the patient in the event of a mental health crisis (suicidal ideations/suicide attempt).  With written consent from the patient, the family member/significant other has been provided the following suicide prevention education, prior to the and/or following the discharge of the patient.  The suicide prevention education provided includes the following:  Suicide risk factors  Suicide prevention and interventions  National Suicide Hotline telephone number  [facility] assessment telephone number  [facility] Emergency Assistance 911  [facility] and/or Residential Mobile Crisis Unit telephone number  Request made of family/significant other to:  Remove weapons (e.g., guns, rifles, knives), all items previously/currently identified as safety concern.    Remove drugs/medications (over-the-counter, prescriptions, illicit drugs), all items previously/currently identified as a safety concern.  The family member/significant other verbalizes understanding of the suicide prevention education information provided.  The family member/significant other agrees to remove the items of safety concern listed above.  Morrie Sheldon reports she does not have any questions at this time. She reports the patient's employer called her and requested for the patient to have a hospital note, once he is discharged from the hospital.   CSW ensured Morrie Sheldon, the patient would discharge with hospital documentation regarding his admission. Morrie Sheldon states she spoke with the doctor and has no further questions or concerns. CSW informed Morrie Sheldon the patient was scheduled for discharge  tomorrow.   Maeola Sarah 06/08/2019, 1:22 PM

## 2019-06-08 NOTE — Progress Notes (Signed)
   06/08/19 2101  Psych Admission Type (Psych Patients Only)  Admission Status Voluntary  Psychosocial Assessment  Patient Complaints None  Eye Contact Fair  Facial Expression Animated  Affect Appropriate to circumstance  Speech Logical/coherent  Interaction Assertive  Motor Activity Slow  Appearance/Hygiene Unremarkable  Thought Process  Coherency WDL  Content WDL  Delusions None reported or observed  Perception WDL  Hallucination None reported or observed  Judgment WDL  Confusion None  Danger to Self  Current suicidal ideation? Denies  Agreement Not to Harm Self Yes  Description of Agreement verbal  Danger to Others  Danger to Others None reported or observed
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
## 2019-06-09 DIAGNOSIS — F4329 Adjustment disorder with other symptoms: Secondary | ICD-10-CM

## 2019-06-09 MED ORDER — SERTRALINE HCL 50 MG PO TABS: 50.0000 mg | ORAL_TABLET | Freq: Every day | ORAL | 0 refills | Status: AC

## 2019-06-09 MED ORDER — HYDROXYZINE HCL 25 MG PO TABS: 25.0000 mg | ORAL_TABLET | Freq: Three times a day (TID) | ORAL | 0 refills | Status: DC | PRN

## 2019-06-09 MED ORDER — METFORMIN HCL 500 MG PO TABS: 500.0000 mg | ORAL_TABLET | Freq: Two times a day (BID) | ORAL | 0 refills | Status: AC

## 2019-06-09 MED ORDER — HYDROXYZINE HCL 25 MG PO TABS: 25.0000 mg | ORAL_TABLET | Freq: Three times a day (TID) | ORAL | 0 refills | Status: AC | PRN

## 2019-06-09 MED ORDER — TRAZODONE HCL 50 MG PO TABS: 50.0000 mg | ORAL_TABLET | Freq: Every evening | ORAL | 0 refills | Status: AC | PRN

## 2019-06-09 MED ORDER — SERTRALINE HCL 50 MG PO TABS: 50.0000 mg | ORAL_TABLET | Freq: Every day | ORAL | 0 refills | Status: DC

## 2019-06-09 MED ORDER — METFORMIN HCL 500 MG PO TABS: 500.0000 mg | ORAL_TABLET | Freq: Two times a day (BID) | ORAL | 0 refills | Status: DC

## 2019-06-09 NOTE — Progress Notes (Signed)
D:  Patient's self inventory sheet, patient sleeps good, no sleep medication.  Good appetite, normal energy level, good concentration.  Denied depression, anxiety and hopeless.  Denied withdrawals.  Denied SI.  Denied physical problems.  Denied pain.  Goal is discharge.  Plans to discharge home. A:  Medications administered per MD orders.  Emotional support and encouragement given patient. R:  Denied SI and HI, contracts for safety;.  Denied A/V hallucinations.  Safety maintained with 15 minute checks.
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
## 2019-06-09 NOTE — Progress Notes (Signed)
  [facility] Adult Case Management Discharge Plan :  Will you be returning to the same living situation after discharge:  No. Staying with mother, Irven Shelling. At discharge, do you have transportation home?: Yes,  truck is here.  Do you have the ability to pay for your medications: Yes,  [employer] insurance.  Release of information consent forms completed and in the chart; work letters on chart.  Patient to Follow up at: Follow-up Information    [facility], [place]. Go on 06/20/2019.   Why: You have an appointment scheduled with Nicholos Johns cyst on 06/20/19 at 4:40 pm.  This appointment will be held in person.  Please have your insurance information and your discharge summary available. Contact information: [address] [place] [address] [phone]           Next level of care provider has access to [facility] Link:no  Safety Planning and Suicide Prevention discussed: Yes,  with wife, Morrie Sheldon.  Has patient been referred to the Quitline?: N/A patient is not a smoker  Patient has been referred for addiction treatment: Yes  Darreld Mclean, LCSWA 06/09/2019, 9:20 AM
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
## 2019-06-09 NOTE — Progress Notes (Signed)
Discharge Note:  Patient discharged with family member.  Patient denied SI and HI.  Denied A/V hallucinations.  Suicide prevention information given and discussed with patient who stated he understood and had no questions.  Patient received all belongings, clothing, toiletries, etc.  Patient stated he appreciated all assistance received from [facility] staff.  All required discharge information given to patient at discharge.

## 2020-05-03 ENCOUNTER — Other Ambulatory Visit: Payer: Self-pay

## 2020-05-03 ENCOUNTER — Encounter: Payer: Self-pay | Admitting: Emergency Medicine

## 2020-05-03 ENCOUNTER — Emergency Department: Payer: 59

## 2020-05-03 ENCOUNTER — Emergency Department
Admission: EM | Admit: 2020-05-03 | Discharge: 2020-05-03 | Disposition: A | Discharge status: Home / Self Care | Payer: 59 | Attending: Emergency Medicine | Admitting: Emergency Medicine

## 2020-05-03 DIAGNOSIS — E119 Type 2 diabetes mellitus without complications: Secondary | ICD-10-CM | POA: Insufficient documentation

## 2020-05-03 DIAGNOSIS — Z23 Encounter for immunization: Secondary | ICD-10-CM | POA: Diagnosis not present

## 2020-05-03 DIAGNOSIS — Z7984 Long term (current) use of oral hypoglycemic drugs: Secondary | ICD-10-CM | POA: Diagnosis not present

## 2020-05-03 DIAGNOSIS — S60511A Abrasion of right hand, initial encounter: Secondary | ICD-10-CM | POA: Insufficient documentation

## 2020-05-03 DIAGNOSIS — Y92410 Unspecified street and highway as the place of occurrence of the external cause: Secondary | ICD-10-CM | POA: Insufficient documentation

## 2020-05-03 DIAGNOSIS — S0003XA Contusion of scalp, initial encounter: Secondary | ICD-10-CM | POA: Diagnosis not present

## 2020-05-03 DIAGNOSIS — S0990XA Unspecified injury of head, initial encounter: Secondary | ICD-10-CM | POA: Diagnosis present

## 2020-05-03 MED ORDER — MELOXICAM 15 MG PO TABS: 15.0000 mg | ORAL_TABLET | Freq: Every day | ORAL | 0 refills | Status: AC

## 2020-05-03 MED ORDER — TETANUS-DIPHTH-ACELL PERTUSSIS 5-2.5-18.5 LF-MCG/0.5 IM SUSY
0.5000 mL | PREFILLED_SYRINGE | Freq: Once | INTRAMUSCULAR | Status: AC
  Administered 2020-05-03: 0.5 mL via INTRAMUSCULAR
  Filled 2020-05-03: qty 0.5
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
## 2020-12-16 IMAGING — CT CT HEAD W/O CM
3 series · 15 of 45 positions shown, 18 images · non-contrast
Comparison: None.

CLINICAL DATA: Blurry vision and gait abnormality.

EXAM:
CT HEAD WITHOUT CONTRAST
TECHNIQUE: Contiguous axial images were obtained from the base of the skull
through the vertex without intravenous contrast.

[Series 2: head wo · axial · 0.41mm/px · z∈[-56,+59]mm · 9 of 28 slices shown, 12 images]
[im 3/28  brain]
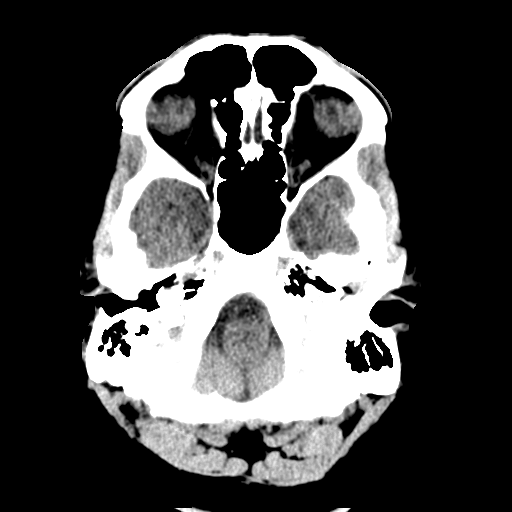
[im 3/28  bone]
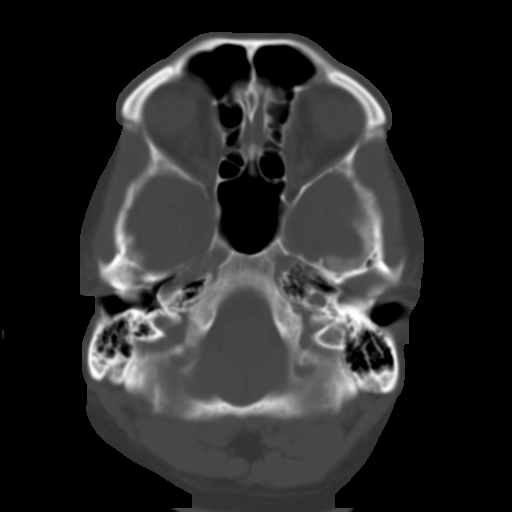
[im 6/28  brain]
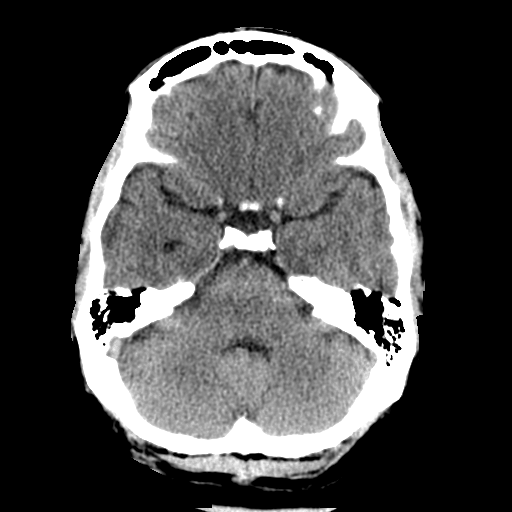
[im 9/28  brain]
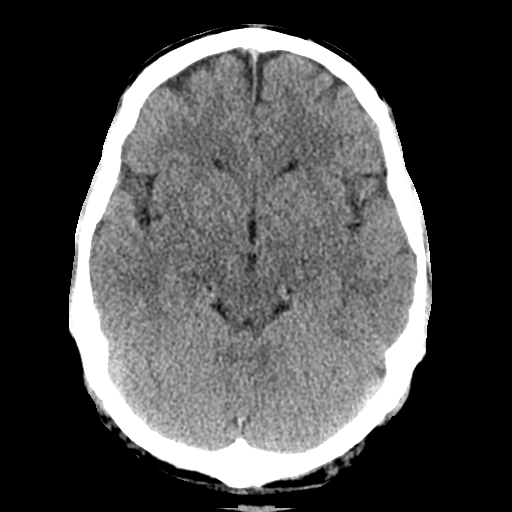
[im 12/28  brain]
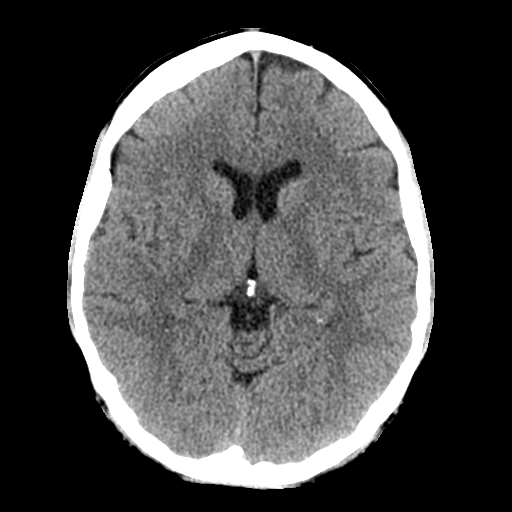
[im 15/28  brain]
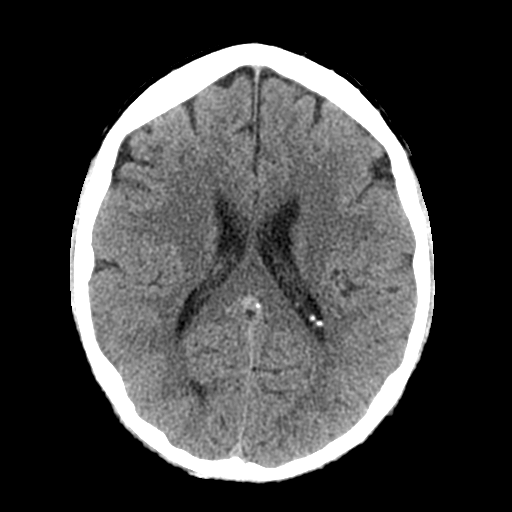
[im 15/28  bone]
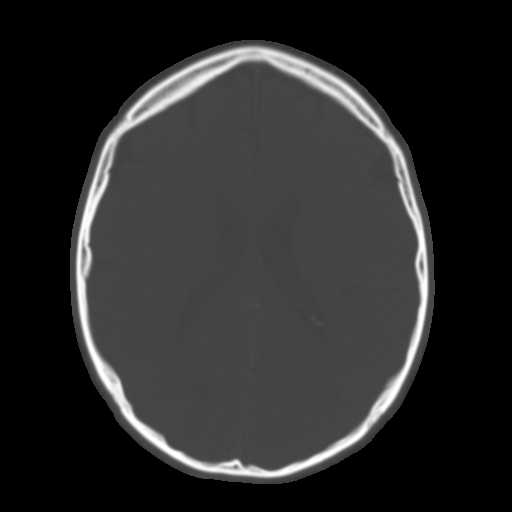
[im 17/28  brain]
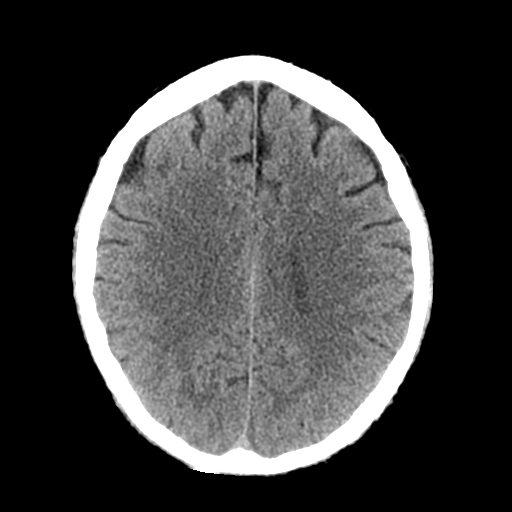
[im 20/28  brain]
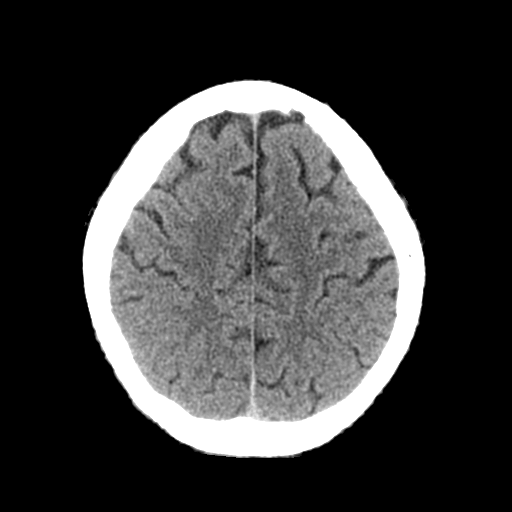
[im 23/28  brain]
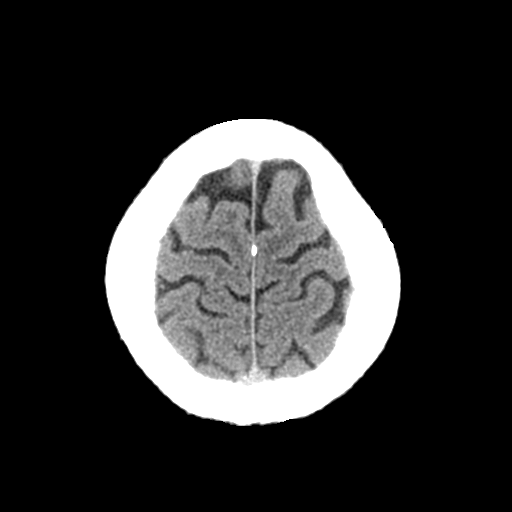
[im 26/28  brain]
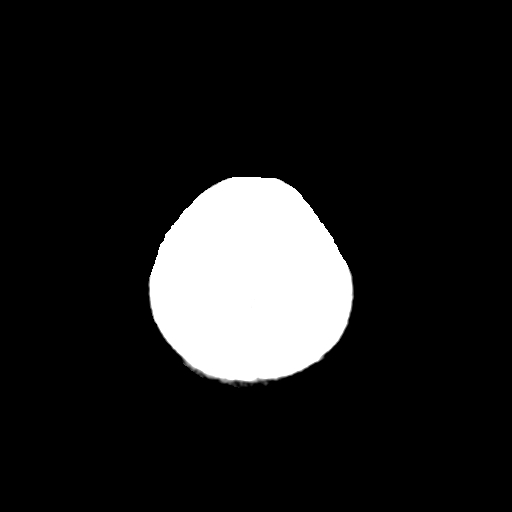
[im 26/28  bone]
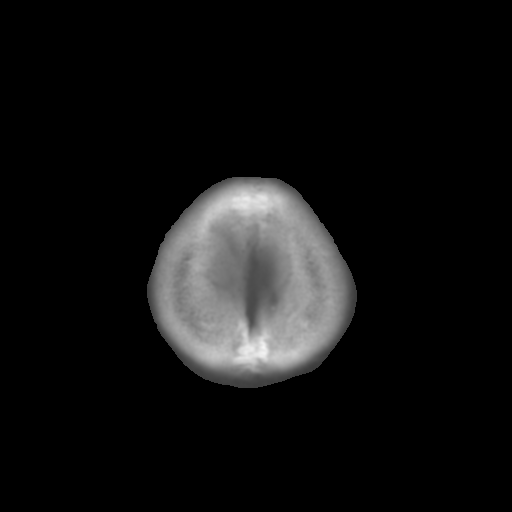

[Series 4: coronal soft tissue · coronal · 0.29mm/px · 3 of 65 slices shown]
[im 22/65  brain]
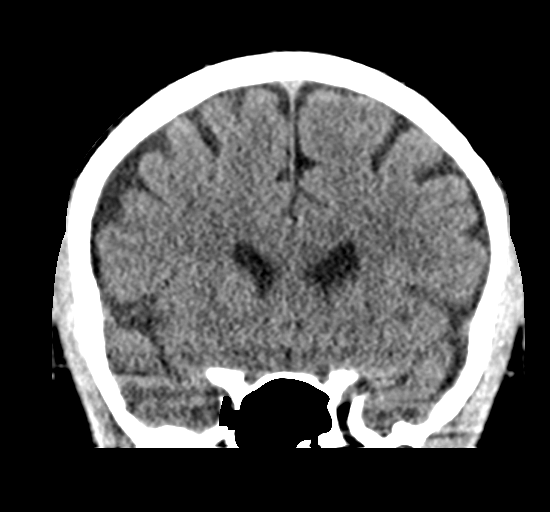
[im 29/65  brain]
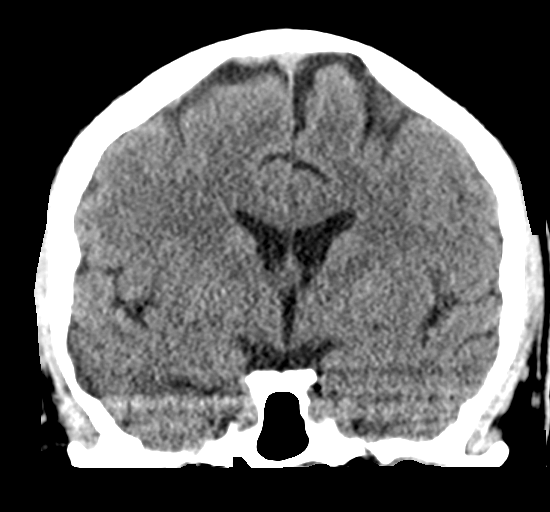
[im 36/65  brain]
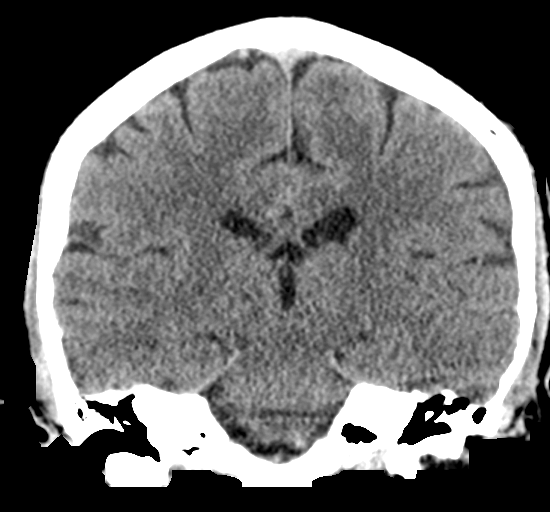

[Series 5: sagittal soft tissue · sagittal · 0.30mm/px · 3 of 53 slices shown]
[im 18/53  brain]
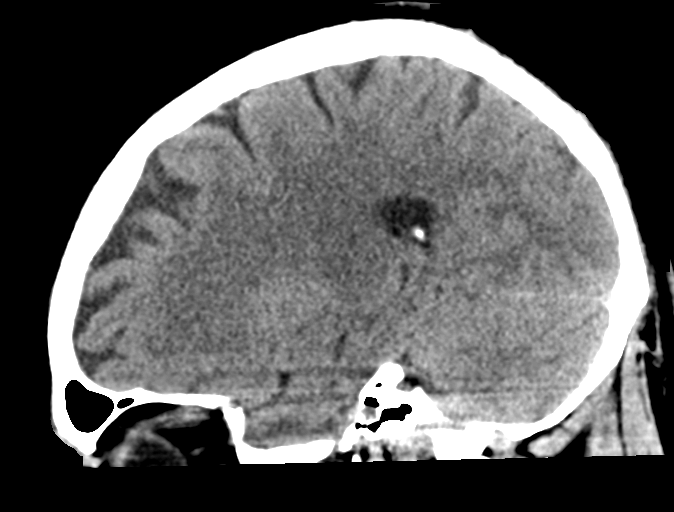
[im 27/53  brain]
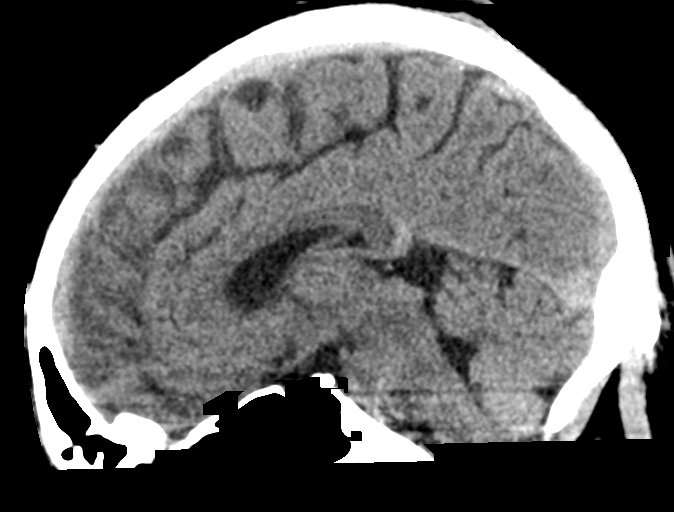
[im 35/53  brain]
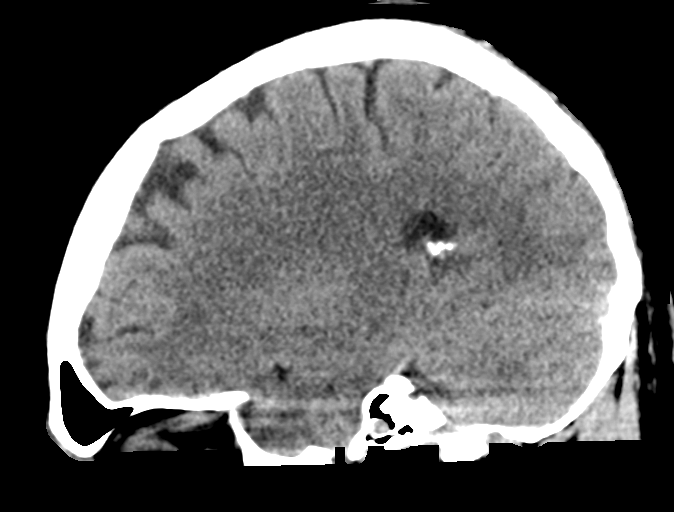

[15 of 45 positions shown; findings below may reference images not displayed]

FINDINGS: Brain: There is no mass, hemorrhage or extra-axial collection. The
size and configuration of the ventricles and extra-axial CSF spaces
are normal. The brain parenchyma is normal, without acute or chronic
infarction.

Vascular: No abnormal hyperdensity of the major intracranial
arteries or dural venous sinuses. No intracranial atherosclerosis.

Skull: The visualized skull base, calvarium and extracranial soft
tissues are normal.

Sinuses/Orbits: No fluid levels or advanced mucosal thickening of
the visualized paranasal sinuses. No mastoid or middle ear effusion.
The orbits are normal.
IMPRESSION: Normal head CT.

## 2021-03-16 ENCOUNTER — Other Ambulatory Visit: Payer: Self-pay

## 2021-03-16 ENCOUNTER — Emergency Department: Payer: Self-pay

## 2021-03-16 ENCOUNTER — Encounter: Payer: Self-pay | Admitting: Emergency Medicine

## 2021-03-16 ENCOUNTER — Emergency Department
Admission: EM | Admit: 2021-03-16 | Discharge: 2021-03-16 | Disposition: A | Discharge status: Home / Self Care | Payer: Self-pay | Attending: Emergency Medicine | Admitting: Emergency Medicine

## 2021-03-16 DIAGNOSIS — Z7984 Long term (current) use of oral hypoglycemic drugs: Secondary | ICD-10-CM | POA: Insufficient documentation

## 2021-03-16 DIAGNOSIS — S46211A Strain of muscle, fascia and tendon of other parts of biceps, right arm, initial encounter: Secondary | ICD-10-CM

## 2021-03-16 DIAGNOSIS — E119 Type 2 diabetes mellitus without complications: Secondary | ICD-10-CM | POA: Insufficient documentation

## 2021-03-16 DIAGNOSIS — S46111A Strain of muscle, fascia and tendon of long head of biceps, right arm, initial encounter: Secondary | ICD-10-CM | POA: Insufficient documentation

## 2021-03-16 DIAGNOSIS — X500XXA Overexertion from strenuous movement or load, initial encounter: Secondary | ICD-10-CM | POA: Insufficient documentation

## 2021-03-16 MED ORDER — NAPROXEN 500 MG PO TABS: 500.0000 mg | ORAL_TABLET | Freq: Two times a day (BID) | ORAL | 2 refills | Status: AC

## 2021-03-16 NOTE — ED Notes (Signed)
Patient declined discharge vital signs. 

## 2021-03-16 NOTE — Discharge Instructions (Signed)
Please follow up with orthopedics. Call tomorrow for an appointment.  Wear the sling, except for showering, until evaluated by the specialist.  Return to the ER for symptoms that change or worsen if unable to schedule an appointment.

## 2021-03-16 NOTE — ED Provider Notes (Signed)
[facility] Emergency Department Provider Note ____________________________________________  Time seen: Approximately 3:28 PM  I have reviewed the triage vital signs and the nursing notes.   HISTORY  Chief Complaint Arm Injury    HPI Richard Welch is a 50 y.o. adult who presents to the emergency department for evaluation and treatment of right arm pain. He felt a pop while moving a washer and dryer earlier today. Pain increases with attempt to fully extend the arm at the elbow. No similar symptoms in the past. No alleviating measures prior to arrival.   Past Medical History:  Diagnosis Date   Depression    Kidney stone     Patient Active Problem List   Diagnosis Date Noted   Adjustment disorder with mixed emotional features    MDD (major depressive disorder), recurrent episode, severe (HCC) 06/05/2019   Type 2 diabetes mellitus without complication, without long-term current use of insulin (HCC)    Diphenhydramine overdose of undetermined intent    Fluoxetine hydrochloride poisoning    Gastroesophageal reflux disease    Antihistamines overdose 06/02/2019   Antidepressant overdose 06/02/2019   Leukocytosis 06/02/2019   Hyperglycemia 06/02/2019    History reviewed. No pertinent surgical history.  Prior to Admission medications   Medication Sig Start Date End Date Taking? Authorizing Provider  naproxen (NAPROSYN) 500 MG tablet Take 1 tablet (500 mg total) by mouth 2 (two) times daily with a meal. 03/16/21 03/16/22 Yes Kittie Krizan B, FNP  hydrOXYzine (ATARAX/VISTARIL) 25 MG tablet Take 1 tablet (25 mg total) by mouth 3 (three) times daily as needed for anxiety. 06/09/19   Jackelyn Poling, NP  meloxicam (MOBIC) 15 MG tablet Take 1 tablet (15 mg total) by mouth daily. 05/03/20   Cuthriell, Delorise Royals, PA-C  metFORMIN (GLUCOPHAGE) 500 MG tablet Take 1 tablet (500 mg total) by mouth 2 (two) times daily with a meal. Take twice daily for Type II Diabetes  [place] 06/09/19   Jackelyn Poling, NP  pantoprazole (PROTONIX) 40 MG tablet Take 1 tablet (40 mg total) by mouth daily. 06/05/19   Vassie Loll, MD  sertraline (ZOLOFT) 50 MG tablet Take 1 tablet (50 mg total) by mouth daily. Take 1 table (50 mg) by mouth daily for depression 06/10/19   Jackelyn Poling, NP  traZODone (DESYREL) 50 MG tablet Take 1 tablet (50 mg total) by mouth at bedtime as needed for sleep. 06/09/19   Jackelyn Poling, NP    Allergies Penicillins and Sulfa antibiotics  No family history on file.  Social History Social History   Tobacco Use   Smoking status: Never   Smokeless tobacco: Never  Substance Use Topics   Alcohol use: No   Drug use: No    Review of Systems Constitutional: Negative for fever. Cardiovascular: Negative for chest pain. Respiratory: Negative for shortness of breath. Musculoskeletal: Positive for right arm pain Skin: Negative for open wounds or lesions  Neurological: Negative for decrease in sensation  ____________________________________________   PHYSICAL EXAM:  VITAL SIGNS: ED Triage Vitals  Enc Vitals Group     BP 03/16/21 1423 (!) 141/88     Pulse Rate 03/16/21 1423 81     Resp 03/16/21 1423 20     Temp 03/16/21 1423 98.3 F (36.8 C)     Temp Source 03/16/21 1423 Oral     SpO2 03/16/21 1423 98 %     Weight 03/16/21 1408 160 lb (72.6 kg)     Height 03/16/21 1408 5\' 8"  (  1.727 m)     Head Circumference --      Peak Flow --      Pain Score 03/16/21 1407 3     Pain Loc --      Pain Edu? --      Excl. in GC? --     Constitutional: Alert and oriented. Well appearing and in no acute distress. Eyes: Conjunctivae are clear without discharge or drainage Head: Atraumatic Neck: Supple Respiratory: No cough. Respirations are even and unlabored. Musculoskeletal: Bicep muscle bulge on right compared to left. No pain with motion against resistance.  Neurologic: Motor and sensory function of the right upper extremity intact.  Skin: No  ecchymosis, open wounds, or lesions.  Psychiatric: Affect and behavior are appropriate.  ____________________________________________   LABS (all labs ordered are listed, but only abnormal results are displayed)  Labs Reviewed - No data to display ____________________________________________  RADIOLOGY  Image of the right elbow without acute findings.  I, Kem Boroughs, personally viewed and evaluated these images (plain radiographs) as part of my medical decision making, as well as reviewing the written report by the radiologist.  DG Elbow 2 Views Right  Result Date: 03/16/2021 CLINICAL DATA:  Elbow pain EXAM: RIGHT ELBOW - 2 VIEW COMPARISON:  None. FINDINGS: There is no evidence of fracture, dislocation, or joint effusion. There is no evidence of arthropathy or other focal bone abnormality. Soft tissues are unremarkable. IMPRESSION: Negative. Electronically Signed   By: Marlan Palau M.D.   On: 03/16/2021 16:49   ____________________________________________   PROCEDURES  Procedures  ____________________________________________   INITIAL IMPRESSION / ASSESSMENT AND PLAN / ED COURSE  Richard Welch is a 50 y.o. who presents to the emergency department for evaluation of acute onset right arm pain . See HPI.   History, symptoms and exam most consistent with Bicep tendon rupture. Will get x-ray to rule out avulsion fracture. Patient aware and agreeable.  X-ray of the right elbow without evidence of avulsion fracture. Will place in a sling and have him follow up with ortho.  Patient instructed to follow-up with orthopedics next week.  He was also instructed to return to the emergency department for symptoms that change or worsen if unable schedule an appointment with orthopedics or primary care.  Medications - No data to display  Pertinent labs & imaging results that were available during my care of the patient were reviewed by me and considered in my medical decision  making (see chart for details).   _________________________________________   FINAL CLINICAL IMPRESSION(S) / ED DIAGNOSES  Final diagnoses:  Rupture of right biceps tendon, initial encounter    ED Discharge Orders          Ordered    naproxen (NAPROSYN) 500 MG tablet  2 times daily with meals        03/16/21 1653             If controlled substance prescribed during this visit, 12 month history viewed on the [place] prior to issuing an initial prescription for Schedule II or III opiod.    Chinita Pester, FNP 03/16/21 1659    Sharyn Creamer, MD 03/16/21 2150

## 2021-03-16 NOTE — ED Triage Notes (Signed)
Pt reports was moving a washer and dryer and felt a pop in his right arm. Pt c/o pain and decreased movement to right arm around elbow.

## 2022-02-10 ENCOUNTER — Ambulatory Visit: Payer: Self-pay | Admitting: Nurse Practitioner

## 2022-02-10 ENCOUNTER — Encounter: Payer: Self-pay | Admitting: Nurse Practitioner

## 2022-02-10 DIAGNOSIS — Z113 Encounter for screening for infections with a predominantly sexual mode of transmission: Secondary | ICD-10-CM

## 2022-02-10 LAB — HM HEPATITIS C SCREENING LAB: HM Hepatitis Screen: NEGATIVE

## 2022-02-10 LAB — HM HIV SCREENING LAB: HM HIV Screening: NEGATIVE

## 2022-02-10 NOTE — Progress Notes (Signed)
Pt here for STD screening.  Condoms declined.  Richard Welch Richard Stachia Slutsky, RN ° °

## 2022-02-10 NOTE — Progress Notes (Signed)
[facility] Department STI clinic/screening visit  Subjective:  Richard Welch is a 51 y.o. adult being seen today for an STI screening visit. The patient reports they do not have symptoms.    Patient has the following medical conditions:   Patient Active Problem List   Diagnosis Date Noted   Adjustment disorder with mixed emotional features    MDD (major depressive disorder), recurrent episode, severe ([facility]) 06/05/2019   Type 2 diabetes mellitus without complication, without long-term current use of insulin (HCC)    Diphenhydramine overdose of undetermined intent    Fluoxetine hydrochloride poisoning    Gastroesophageal reflux disease    Antihistamines overdose 06/02/2019   Antidepressant overdose 06/02/2019   Leukocytosis 06/02/2019   Hyperglycemia 06/02/2019     Chief Complaint  Patient presents with   SEXUALLY TRANSMITTED DISEASE    Screening- patient states he is not having any symptoms and just wants to be screened     HPI  Patient reports to clinic today for STD screening.  Patient reports being asymptomatic.    Does the patient or their partner desires a pregnancy in the next year? No  Screening for MPX risk: Does the patient have an unexplained rash? No Is the patient MSM? No Does the patient endorse multiple sex partners or anonymous sex partners? No Did the patient have close or sexual contact with a person diagnosed with MPX? No Has the patient traveled outside the [place] where MPX is endemic? No Is there a high clinical suspicion for MPX-- evidenced by one of the following No  -Unlikely to be chickenpox  -Lymphadenopathy  -Rash that present in same phase of evolution on any given body part   See flowsheet for further details and programmatic requirements.   Immunization History  Administered Date(s) Administered   Tdap 02/02/2018, 05/03/2020     The following portions of the patient's history were reviewed and updated as appropriate:  allergies, current medications, past medical history, past social history, past surgical history and problem list.  Objective:  There were no vitals filed for this visit.  Physical Exam Constitutional:      Appearance: Normal appearance.  HENT:     Head: Normocephalic. No abrasion, masses or laceration. Hair is normal.     Right Ear: External ear normal.     Left Ear: External ear normal.     Nose: Nose normal.     Mouth/Throat:     Lips: Pink.     Mouth: Mucous membranes are moist. No oral lesions.     Pharynx: No pharyngeal swelling, oropharyngeal exudate, posterior oropharyngeal erythema or uvula swelling.     Tonsils: No tonsillar exudate or tonsillar abscesses.     Comments: Poor dentition  Eyes:     General: Lids are normal.        Right eye: No discharge.        Left eye: No discharge.     Conjunctiva/sclera: Conjunctivae normal.     Right eye: No exudate.    Left eye: No exudate. Abdominal:     General: Abdomen is flat.     Palpations: Abdomen is soft.     Tenderness: There is no abdominal tenderness. There is no rebound.  Genitourinary:    Pubic Area: No rash or pubic lice.      Penis: Normal and circumcised. No erythema or discharge.      Testes: Normal.        Right: Mass or tenderness not present.  Left: Mass or tenderness not present.     Rectum: Normal.     Comments: Discharge amount: none Color: none  Musculoskeletal:     Cervical back: Full passive range of motion without pain, normal range of motion and neck supple.  Lymphadenopathy:     Cervical: Cervical adenopathy present.     Right cervical: Superficial cervical adenopathy present. No deep or posterior cervical adenopathy.    Left cervical: Superficial cervical adenopathy present. No deep or posterior cervical adenopathy.     Upper Body:     Right upper body: No supraclavicular, axillary or epitrochlear adenopathy.     Left upper body: No supraclavicular, axillary or epitrochlear adenopathy.      Lower Body: No right inguinal adenopathy. No left inguinal adenopathy.  Skin:    General: Skin is warm and dry.     Findings: No lesion or rash.  Neurological:     Mental Status: He is alert and oriented to person, place, and time.  Psychiatric:        Attention and Perception: Attention normal.        Mood and Affect: Mood normal.        Speech: Speech normal.        Behavior: Behavior normal. Behavior is cooperative.       Assessment and Plan:  Richard Welch is a 51 y.o. adult presenting to the [facility] Department for STI screening  1. Screening examination for venereal disease -51 year old male in clinic today for STD screening. -Patient does not have STI symptoms Patient accepted all screenings including oral GC, urine CT/GC and bloodwork for HIV/RPR.  Patient meets criteria for HepB screening? No. Ordered? No - low risk Patient meets criteria for HepC screening? Yes. Ordered? Yes Recommended condom use with all sex Discussed importance of condom use for STI prevent   Discussed time line for [facility] results and that patient will be called with positive results and encouraged patient to call if he had not heard in 2 weeks Recommended returning for continued or worsening symptoms.    - HIV/HCV [facility] - Syphilis Serology, [facility] - Chlamydia/GC NAA, Confirmation - Gonococcus culture   Total time spent: 30 minutes   Return if symptoms worsen or fail to improve.    Glenna Fellows, FNP

## 2022-02-14 LAB — GONOCOCCUS CULTURE

## 2022-02-14 LAB — CHLAMYDIA/GC NAA, CONFIRMATION
Chlamydia trachomatis, NAA: NEGATIVE
Neisseria gonorrhoeae, NAA: NEGATIVE

## 2022-02-27 NOTE — Addendum Note (Signed)
Addended by: Cletis Media on: 02/27/2022 02:11 PM   Modules accepted: Orders
